# Patient Record
Sex: Male | Born: 1985 | Race: Black or African American | Hispanic: No | Marital: Single | Smoking: Current every day smoker
Health system: Southern US, Community
[De-identification: ages and names within clinical notes are randomized; demographics above are authoritative.]

---

## 2006-04-07 ENCOUNTER — Emergency Department (HOSPITAL_COMMUNITY): Admission: EM | Admit: 2006-04-07 | Discharge: 2006-04-07 | Payer: Self-pay | Admitting: Emergency Medicine

## 2006-08-28 ENCOUNTER — Emergency Department (HOSPITAL_COMMUNITY): Admission: EM | Admit: 2006-08-28 | Discharge: 2006-08-28 | Payer: Self-pay | Admitting: *Deleted

## 2007-10-16 ENCOUNTER — Emergency Department (HOSPITAL_COMMUNITY): Admission: EM | Admit: 2007-10-16 | Discharge: 2007-10-17 | Payer: Self-pay | Admitting: Emergency Medicine

## 2008-02-02 ENCOUNTER — Emergency Department (HOSPITAL_COMMUNITY): Admission: EM | Admit: 2008-02-02 | Discharge: 2008-02-02 | Payer: Self-pay | Admitting: Emergency Medicine

## 2010-10-22 ENCOUNTER — Emergency Department (HOSPITAL_BASED_OUTPATIENT_CLINIC_OR_DEPARTMENT_OTHER)
Admission: EM | Admit: 2010-10-22 | Discharge: 2010-10-22 | Disposition: A | Discharge status: Home / Self Care | Payer: Self-pay | Attending: Emergency Medicine | Admitting: Emergency Medicine

## 2010-10-22 ENCOUNTER — Encounter: Payer: Self-pay | Admitting: *Deleted

## 2010-10-22 DIAGNOSIS — T300 Burn of unspecified body region, unspecified degree: Secondary | ICD-10-CM

## 2010-10-22 DIAGNOSIS — T23119A Burn of first degree of unspecified thumb (nail), initial encounter: Secondary | ICD-10-CM | POA: Insufficient documentation

## 2010-10-22 DIAGNOSIS — X12XXXA Contact with other hot fluids, initial encounter: Secondary | ICD-10-CM | POA: Insufficient documentation

## 2010-10-22 MED ORDER — HYDROCODONE-ACETAMINOPHEN 5-325 MG PO TABS: 1.0000 | ORAL_TABLET | ORAL | Status: AC | PRN

## 2010-10-22 MED ORDER — HYDROCODONE-ACETAMINOPHEN 5-325 MG PO TABS
1.0000 | ORAL_TABLET | Freq: Once | ORAL | Status: AC
  Administered 2010-10-22: 1 via ORAL
  Filled 2010-10-22: qty 1

## 2010-10-22 MED ORDER — IBUPROFEN 800 MG PO TABS: 800.0000 mg | ORAL_TABLET | Freq: Three times a day (TID) | ORAL | Status: AC

## 2010-10-22 NOTE — ED Provider Notes (Signed)
History     CSN: [id number] Arrival date & time: 10/22/2010  8:58 PM  Chief Complaint  Patient presents with  . Hand Burn   HPI Pt was putting a piece of chicken in frying pan and the grease splashed onto right hand.  Covered hand w/ butter to ease pain but it is still severe.  No associated symptoms and otherwise feeling well.   History reviewed. No pertinent past medical history.  History reviewed. No pertinent past surgical history.  No family history on file.  History  Substance Use Topics  . Smoking status: Current Everyday Smoker -- 0.5 packs/day  . Smokeless tobacco: Not on file  . Alcohol Use: Yes     several times a week      Review of Systems  All other systems reviewed and are negative.    Physical Exam  BP 133/84  Pulse 65  Temp(Src) 98 F (36.7 C) (Oral)  Resp 20  SpO2 100%  Physical Exam  Nursing note and vitals reviewed. Constitutional: He is oriented to person, place, and time. He appears well-developed and well-nourished. No distress.  HENT:  Head: Normocephalic and atraumatic.  Eyes:       Normal appearance  Neck: Normal range of motion.  Neurological: He is alert and oriented to person, place, and time.  Skin: Skin is warm and dry. No rash noted.       Right thenar eminence and palmar surface of thumb erythematous and ttp.  No blisters.  Passive ROM of thumb painful.  Distal sensation intact.   Psychiatric: He has a normal mood and affect. His behavior is normal.    ED Course  Procedures  MDM Pt presents w/ 1st deg burn of right thumb.  Pain has improved in ED w/ ice.  Nursing staff wrapped in moist gauze.  Pt discharged home w/ pain medication and recommendations for conservative therapy.  Signs of secondary infection discussed.       Otilio Miu, PA 10/22/10 2216  Otilio Miu, PA 10/22/10 2217
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
## 2010-10-25 NOTE — ED Provider Notes (Signed)
History/physical exam/procedure(s) were performed by non-physician practitioner and as supervising physician I was immediately available for consultation/collaboration. I have reviewed all notes and am in agreement with care and plan.   Hilario Quarry, MD 10/25/10 [phone]

## 2010-12-09 LAB — ETHANOL: Alcohol, Ethyl (B): 59 — ABNORMAL HIGH

## 2010-12-24 LAB — CBC
HCT: 40.4
MCV: 74 — ABNORMAL LOW
Platelets: 241
RBC: 5.46
RDW: 13
WBC: 6.9

## 2010-12-24 LAB — URINALYSIS, ROUTINE W REFLEX MICROSCOPIC
Ketones, ur: 15 — AB
Urobilinogen, UA: 1
pH: 6

## 2010-12-24 LAB — COMPREHENSIVE METABOLIC PANEL
ALT: 10
AST: 18
Albumin: 3.9
BUN: 10
Calcium: 8.8
Creatinine, Ser: 1.01
GFR calc non Af Amer: >60
Total Protein: 6.1

## 2010-12-24 LAB — URINE MICROSCOPIC-ADD ON

## 2010-12-24 LAB — DIFFERENTIAL: Monocytes Absolute: 0.5

## 2011-02-23 ENCOUNTER — Emergency Department (HOSPITAL_BASED_OUTPATIENT_CLINIC_OR_DEPARTMENT_OTHER)
Admission: EM | Admit: 2011-02-23 | Discharge: 2011-02-24 | Disposition: A | Discharge status: Home / Self Care | Payer: Self-pay | Attending: Emergency Medicine | Admitting: Emergency Medicine

## 2011-02-23 ENCOUNTER — Emergency Department (INDEPENDENT_AMBULATORY_CARE_PROVIDER_SITE_OTHER): Payer: Self-pay

## 2011-02-23 ENCOUNTER — Encounter (HOSPITAL_BASED_OUTPATIENT_CLINIC_OR_DEPARTMENT_OTHER): Payer: Self-pay | Admitting: *Deleted

## 2011-02-23 DIAGNOSIS — S022XXB Fracture of nasal bones, initial encounter for open fracture: Secondary | ICD-10-CM | POA: Insufficient documentation

## 2011-02-23 DIAGNOSIS — W19XXXA Unspecified fall, initial encounter: Secondary | ICD-10-CM

## 2011-02-23 DIAGNOSIS — S0120XA Unspecified open wound of nose, initial encounter: Secondary | ICD-10-CM | POA: Insufficient documentation

## 2011-02-23 DIAGNOSIS — M25579 Pain in unspecified ankle and joints of unspecified foot: Secondary | ICD-10-CM | POA: Insufficient documentation

## 2011-02-23 DIAGNOSIS — S82891A Other fracture of right lower leg, initial encounter for closed fracture: Secondary | ICD-10-CM

## 2011-02-23 MED ORDER — TETANUS-DIPHTH-ACELL PERTUSSIS 5-2.5-18.5 LF-MCG/0.5 IM SUSP
0.5000 mL | Freq: Once | INTRAMUSCULAR | Status: AC
  Administered 2011-02-24: 0.5 mL via INTRAMUSCULAR
  Filled 2011-02-23: qty 0.5

## 2011-02-23 MED ORDER — LIDOCAINE HCL (PF) 1 % IJ SOLN
5.0000 mL | Freq: Once | INTRAMUSCULAR | Status: AC
  Administered 2011-02-24: 5 mL
  Filled 2011-02-23: qty 5

## 2011-02-23 NOTE — ED Notes (Signed)
Pt has laceration to left cheek. Pt has abrasion to left posterior shoulder. No active bleeding.
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
## 2011-02-24 DIAGNOSIS — W19XXXA Unspecified fall, initial encounter: Secondary | ICD-10-CM

## 2011-02-24 DIAGNOSIS — J3489 Other specified disorders of nose and nasal sinuses: Secondary | ICD-10-CM

## 2011-02-24 DIAGNOSIS — S022XXA Fracture of nasal bones, initial encounter for closed fracture: Secondary | ICD-10-CM

## 2011-02-24 MED ORDER — HYDROCODONE-ACETAMINOPHEN 5-500 MG PO TABS: 1.0000 | ORAL_TABLET | Freq: Four times a day (QID) | ORAL | Status: AC | PRN

## 2011-02-24 MED ORDER — CEPHALEXIN 500 MG PO CAPS: 500.0000 mg | ORAL_CAPSULE | Freq: Four times a day (QID) | ORAL | Status: AC

## 2011-02-24 MED ORDER — CEPHALEXIN 250 MG PO CAPS
500.0000 mg | ORAL_CAPSULE | Freq: Once | ORAL | Status: AC
  Administered 2011-02-24: 500 mg via ORAL
  Filled 2011-02-24: qty 2

## 2011-02-24 MED ORDER — HYDROCODONE-ACETAMINOPHEN 5-325 MG PO TABS
2.0000 | ORAL_TABLET | Freq: Once | ORAL | Status: AC
  Administered 2011-02-24: 2 via ORAL
  Filled 2011-02-24: qty 2

## 2013-10-28 ENCOUNTER — Emergency Department (HOSPITAL_COMMUNITY)
Admission: EM | Admit: 2013-10-28 | Discharge: 2013-10-28 | Disposition: A | Discharge status: Home / Self Care | Payer: Self-pay | Attending: Emergency Medicine | Admitting: Emergency Medicine

## 2013-10-28 ENCOUNTER — Encounter (HOSPITAL_COMMUNITY): Payer: Self-pay | Admitting: Emergency Medicine

## 2013-10-28 DIAGNOSIS — K089 Disorder of teeth and supporting structures, unspecified: Secondary | ICD-10-CM | POA: Insufficient documentation

## 2013-10-28 DIAGNOSIS — F17200 Nicotine dependence, unspecified, uncomplicated: Secondary | ICD-10-CM | POA: Insufficient documentation

## 2013-10-28 DIAGNOSIS — K0889 Other specified disorders of teeth and supporting structures: Secondary | ICD-10-CM

## 2013-10-28 MED ORDER — PENICILLIN V POTASSIUM 500 MG PO TABS: 500.0000 mg | ORAL_TABLET | Freq: Four times a day (QID) | ORAL | Status: DC

## 2013-10-28 MED ORDER — HYDROCODONE-ACETAMINOPHEN 5-325 MG PO TABS
2.0000 | ORAL_TABLET | Freq: Once | ORAL | Status: AC
  Administered 2013-10-28: 2 via ORAL
  Filled 2013-10-28: qty 2

## 2013-10-28 MED ORDER — HYDROCODONE-ACETAMINOPHEN 5-325 MG PO TABS: 2.0000 | ORAL_TABLET | ORAL | Status: DC | PRN

## 2013-10-28 MED ORDER — PENICILLIN V POTASSIUM 500 MG PO TABS
500.0000 mg | ORAL_TABLET | Freq: Once | ORAL | Status: AC
  Administered 2013-10-28: 500 mg via ORAL
  Filled 2013-10-28: qty 1

## 2013-10-28 NOTE — ED Provider Notes (Signed)
CSN: [id number]     Arrival date & time 10/28/13  1947 History  This chart was scribed for non-physician practitioner working with Lee HornJohn M Bednar, MD by Lee Fleming, ED Scribe. This patient was seen in room WTR6/WTR6 and the patient's care was started at 8:42 PM.   Chief Complaint  Patient presents with  . Dental Pain   The history is provided by the patient. No language interpreter was used.   HPI Comments: Lee Fleming is a 28 y.o. male who presents to the Emergency Department complaining of constant left upper dental pain onset three or four days ago. He reports worsening last night and left facial swelling upon wakening this morning. Patient reports constant dental pain throughout the day while at work, prompting him to visit the ED tonight. Patient reports treatment with ibuprofen without relief. Patient denies abscess, infection, or chipped tooth.    History reviewed. No pertinent past medical history. History reviewed. No pertinent past surgical history. No family history on file. History  Substance Use Topics  . Smoking status: Current Every Day Smoker -- 0.50 packs/day  . Smokeless tobacco: Not on file  . Alcohol Use: Yes     Comment: several times a week    Review of Systems  HENT: Positive for dental problem.       Allergies  Review of patient's allergies indicates no known allergies.  Home Medications   Prior to Admission medications   Not on File   Triage Vitals: BP 118/68  Pulse 63  Temp(Src) 98.9 F (37.2 C) (Oral)  Resp 17  SpO2 100%  Physical Exam  Nursing note and vitals reviewed. Constitutional: He is oriented to person, place, and time. He appears well-developed and well-nourished. No distress.  HENT:  Head: Normocephalic and atraumatic.  No trismus. Left upper posterior molar tender to percussion. No drainable abscess noted.   Eyes: Conjunctivae and EOM are normal.  Neck: Normal range of motion. Neck supple.  Cardiovascular: Normal rate and  regular rhythm.  Exam reveals no gallop and no friction rub.   No murmur heard. Pulmonary/Chest: Effort normal and breath sounds normal. He has no wheezes. He has no rales. He exhibits no tenderness.  Abdominal: Soft. There is no tenderness.  Musculoskeletal: Normal range of motion.  Lymphadenopathy:    He has no cervical adenopathy.  Neurological: He is alert and oriented to person, place, and time.  Speech is goal-oriented. Moves limbs without ataxia.   Skin: Skin is warm and dry.  Psychiatric: He has a normal mood and affect. His behavior is normal.    ED Course  Procedures (including critical care time)  COORDINATION OF CARE: 8:44 PM- Will prescribe antibiotic. Patient advised to return to ED if his facial swelling significantly worsens. Discussed treatment plan with patient at bedside and patient agreed to plan.   Labs Review Labs Reviewed - No data to display  Imaging Review No results found.   EKG Interpretation None      MDM   Final diagnoses:  Pain, dental    .8:53 PM Patient will have Veetid and Vicodin here for symptoms. Patient has no signs of abscess or Ludwigs angina at this time. Patient given dental resources for follow up. Patient instructed to return with worsening or concerning symptoms.   I personally performed the services described in this documentation, which was scribed in my presence. The recorded information has been reviewed and is accurate.    Lee Fleming, [place]PA-C 10/28/13 2053

## 2013-10-28 NOTE — ED Notes (Signed)
Pt has a ride home.  

## 2013-10-28 NOTE — ED Notes (Signed)
Pt c/o pain to upper molar area to L side of mouth. Pt states pain started about 3 days ago. Reports now he has some swelling to L side of face. Pt has no acute distress.

## 2013-10-28 NOTE — Discharge Instructions (Signed)
Take Veetid as directed until gone. Take Vicodin as needed for pain. Follow up with Dr. Russella Dar or go to the free dental clinic next weekend. Refer to attached documents for more information.

## 2013-11-07 NOTE — ED Provider Notes (Signed)
Medical screening examination/treatment/procedure(s) were performed by non-physician practitioner and as supervising physician I was immediately available for consultation/collaboration.   EKG Interpretation None       Hurman Horn, MD 11/07/13 2005

## 2015-12-22 ENCOUNTER — Ambulatory Visit (HOSPITAL_COMMUNITY)
Admission: EM | Admit: 2015-12-22 | Discharge: 2015-12-22 | Disposition: A | Discharge status: Home / Self Care | Payer: Self-pay | Attending: Internal Medicine | Admitting: Internal Medicine

## 2015-12-22 ENCOUNTER — Encounter (HOSPITAL_COMMUNITY): Payer: Self-pay | Admitting: *Deleted

## 2015-12-22 DIAGNOSIS — K047 Periapical abscess without sinus: Secondary | ICD-10-CM

## 2015-12-22 DIAGNOSIS — R220 Localized swelling, mass and lump, head: Secondary | ICD-10-CM

## 2015-12-22 MED ORDER — PENICILLIN V POTASSIUM 500 MG PO TABS: 500.0000 mg | ORAL_TABLET | Freq: Four times a day (QID) | ORAL | 0 refills | Status: DC

## 2015-12-22 MED ORDER — TRAMADOL HCL 50 MG PO TABS: 50.0000 mg | ORAL_TABLET | Freq: Four times a day (QID) | ORAL | 0 refills | Status: DC | PRN

## 2015-12-22 MED ORDER — PENICILLIN V POTASSIUM 500 MG PO TABS: 500.0000 mg | ORAL_TABLET | Freq: Four times a day (QID) | ORAL | 0 refills | Status: AC

## 2015-12-22 NOTE — ED Triage Notes (Signed)
Pt  Has  Pain  And  Swelling  To r  Side  Of  Face    With toothache      With  Symptoms  X  2  Days    Pain is in  The  Upper  Jaw  Area        Pt   Sitting    Upright  On the    Exam   Table      In no  Distress  Other than the  Toothache

## 2015-12-22 NOTE — ED Provider Notes (Signed)
CSN: [id number]     Arrival date & time 12/22/15  1258 History   First MD Initiated Contact with Patient 12/22/15 1352     Chief Complaint  Patient presents with  . Facial Swelling   (Consider location/radiation/quality/duration/timing/severity/associated sxs/prior Treatment) Pt here for tooth pain and facial swelling for 2 days now. States that he has a hx of tooth caries. Has not seen a dentist due to financial issues. Has taken Excedrin for pain with minimal relief. Was seen 2 months ago for the same thing. Offered to give a dental block to help with the pain and pt refused states that he rather use the pain medication. Has minimal swelling to upper lip. Denies any sob, no chest pain, no resp distress.       History reviewed. No pertinent past medical history. History reviewed. No pertinent surgical history. History reviewed. No pertinent family history. Social History  Substance Use Topics  . Smoking status: Current Every Day Smoker    Packs/day: 0.50  . Smokeless tobacco: Not on file  . Alcohol use Yes     Comment: several times a week    Review of Systems  Constitutional: Negative.   HENT: Positive for dental problem and facial swelling.   Eyes: Negative.   Respiratory: Negative.   Cardiovascular: Negative.     Allergies  Review of patient's allergies indicates no known allergies.  Home Medications   Prior to Admission medications   Medication Sig Start Date End Date Taking? Authorizing Provider  penicillin v potassium (VEETID) 500 MG tablet Take 1 tablet (500 mg total) by mouth 4 (four) times daily. 12/22/15 12/29/15  Tobi BastosMelanie A Deandra Goering, NP  traMADol (ULTRAM) 50 MG tablet Take 1 tablet (50 mg total) by mouth every 6 (six) hours as needed. 12/22/15   Tobi BastosMelanie A Jarmarcus Wambold, NP   Meds Ordered and Administered this Visit  Medications - No data to display  BP 120/70 (BP Location: Right Arm)   Pulse 68   Temp 98.6 F (37 C) (Oral)   Resp 18   SpO2 100%  No data  found.   Physical Exam  Constitutional: He appears well-developed.  HENT:  multiple dental caries to front 1st pre molar and 2nd molar.   Eyes: Pupils are equal, round, and reactive to light.  Neck: Normal range of motion.  Cardiovascular: Normal rate and regular rhythm.   Pulmonary/Chest: Effort normal and breath sounds normal.    Urgent Care Course   Clinical Course    Procedures (including critical care time)  Labs Review Labs Reviewed - No data to display  Imaging Review No results found.               MDM   1. Dental abscess   2. Facial swelling   3. Dental infection    You will need to see a dentist as soon as possible. Multiple dental caries noted. Take full dose of medications even if you feel better. Take Motrin also to help with pain 800mg  every 6 hours. Educated on the risk of non treatment of caries.     Tobi BastosMelanie A Aila Terra, NP 12/22/15 1414

## 2015-12-22 NOTE — Discharge Instructions (Signed)
You will need to see a dentist as soon as possible. Multiple dental caries noted. Take full dose of medications even if you feel better. Take Motrin also to help with pain 800mg  every 6 hours. Educated on the risk of non treatment of caries.

## 2017-10-10 ENCOUNTER — Emergency Department (HOSPITAL_COMMUNITY): Payer: Self-pay

## 2017-10-10 ENCOUNTER — Other Ambulatory Visit: Payer: Self-pay

## 2017-10-10 ENCOUNTER — Emergency Department (HOSPITAL_COMMUNITY)
Admission: EM | Admit: 2017-10-10 | Discharge: 2017-10-10 | Disposition: A | Discharge status: Home / Self Care | Payer: Self-pay | Attending: Emergency Medicine | Admitting: Emergency Medicine

## 2017-10-10 ENCOUNTER — Encounter (HOSPITAL_COMMUNITY): Payer: Self-pay | Admitting: Emergency Medicine

## 2017-10-10 DIAGNOSIS — M79644 Pain in right finger(s): Secondary | ICD-10-CM | POA: Insufficient documentation

## 2017-10-10 DIAGNOSIS — F17200 Nicotine dependence, unspecified, uncomplicated: Secondary | ICD-10-CM | POA: Insufficient documentation

## 2017-10-10 MED ORDER — KETOROLAC TROMETHAMINE 30 MG/ML IJ SOLN
30.0000 mg | Freq: Once | INTRAMUSCULAR | Status: AC
  Administered 2017-10-10: 30 mg via INTRAMUSCULAR
  Filled 2017-10-10: qty 1

## 2017-10-10 MED ORDER — OXYCODONE-ACETAMINOPHEN 5-325 MG PO TABS
1.0000 | ORAL_TABLET | Freq: Once | ORAL | Status: AC
  Administered 2017-10-10: 1 via ORAL
  Filled 2017-10-10: qty 1

## 2017-10-10 NOTE — ED Provider Notes (Signed)
[facility]-EMERGENCY DEPT Provider Note   CSN: [id number] Arrival date & time: 10/10/17  1425     History   Chief Complaint Chief Complaint  Patient presents with  . Hand Injury    HPI Lee Fleming is a 32 y.o. male who presents today for evaluation of right thumb pain.  He reports that his pain started on Friday while he was at work.  He works as a Financial risk analyst.  He denies any specific trauma, reports that his pain is gradually worsened since then.  He denies any fevers, chills, or systemic symptoms.  He denies any penetrating wounds to the thumb recently.  No personal history of gout.   He has never had any problems like this in the past.  HPI  History reviewed. No pertinent past medical history.  There are no active problems to display for this patient.   History reviewed. No pertinent surgical history.      Home Medications    Prior to Admission medications   Medication Sig Start Date End Date Taking? Authorizing Provider  traMADol (ULTRAM) 50 MG tablet Take 1 tablet (50 mg total) by mouth every 6 (six) hours as needed. 12/22/15   Coralyn Mark, NP    Family History No family history on file.  Social History Social History   Tobacco Use  . Smoking status: Current Every Day Smoker    Packs/day: 0.50  Substance Use Topics  . Alcohol use: Yes    Comment: several times a week  . Drug use: No     Allergies   Patient has no known allergies.   Review of Systems Review of Systems  Constitutional: Negative for chills, diaphoresis, fatigue, fever and unexpected weight change.  Respiratory: Negative for shortness of breath.   Cardiovascular: Negative for chest pain.  Gastrointestinal: Negative for abdominal pain, diarrhea, nausea and vomiting.  Musculoskeletal: Positive for arthralgias and joint swelling. Negative for back pain and neck pain.  Neurological: Negative for weakness and headaches.  Psychiatric/Behavioral: Negative for  confusion.  All other systems reviewed and are negative.    Physical Exam Updated Vital Signs BP 133/84 (BP Location: Left Arm)   Pulse 78   Temp 98.4 F (36.9 C) (Oral)   Resp 18   Ht 5\' 6"  (1.676 m)   Wt 65.8 kg (145 lb)   SpO2 100%   BMI 23.40 kg/m   Physical Exam  Constitutional: He is oriented to person, place, and time. He appears well-developed. No distress.  HENT:  Head: Normocephalic and atraumatic.  Cardiovascular: Intact distal pulses.  Brisk capillary refill of distal right thumb.  Musculoskeletal:  Right thumb is swollen primarily over the thenar eminence.  Area is not abnormally warm.  He has pain with active or passive range of motion of the entire thumb, primarily worse with flexion and opposition.  Neurological: He is alert and oriented to person, place, and time.  Sensation intact to right distal thumb.  Skin: Skin is warm and dry. He is not diaphoretic.  No wounds on right hand, right thumb.  There is no abnormal erythema to the right thumb.  Nursing note and vitals reviewed.    ED Treatments / Results  Labs (all labs ordered are listed, but only abnormal results are displayed) Labs Reviewed - No data to display  EKG None  Radiology Dg Hand Complete Right  Result Date: 10/10/2017 CLINICAL DATA:  Jammed thumb at work 2 days ago.  Pain and swelling. EXAM: RIGHT HAND -  COMPLETE 3+ VIEW COMPARISON:  None. FINDINGS: The mineralization and alignment are normal. There is no evidence of acute fracture or dislocation. The joint spaces are maintained. No focal soft tissue swelling identified. IMPRESSION: No acute osseous findings. Electronically Signed   By: Carey BullocksWilliam  Veazey M.D.   On: 10/10/2017 15:44    Procedures Procedures (including critical care time)  Medications Ordered in ED Medications  ketorolac (TORADOL) 30 MG/ML injection 30 mg (has no administration in time range)  oxyCODONE-acetaminophen (PERCOCET/ROXICET) 5-325 MG per tablet 1 tablet (has  no administration in time range)     Initial Impression / Assessment and Plan / ED Course  I have reviewed the triage vital signs and the nursing notes.  Pertinent labs & imaging results that were available during my care of the patient were reviewed by me and considered in my medical decision making (see chart for details).    Patient presents today for evaluation of right thumb pain.  No injury.  X-rays were obtained without fracture or other acute abnormality.  He does not have systemic symptoms, is afebrile, and the joint is not hot or red, not consistent with septic arthritis.  Lower suspicion for gout given patient's age, and that the joint is not hot or red.  Suspect a tendinitis, probably from repetitive motions at work.  Plan for removable thumb spica Velcro splint, NSAIDs, and hand follow-up.   Return precautions were discussed with patient who states their understanding.  At the time of discharge patient denied any unaddressed complaints or concerns.  Patient is agreeable for discharge home.    Final Clinical Impressions(s) / ED Diagnoses   Final diagnoses:  Pain of right thumb    ED Discharge Orders    None       Norman ClayHammond, Salvator Seppala W, PA-C 10/10/17 Tonna [id number]    Isaacs, Cameron, MD 10/12/17 [phone]
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
## 2017-10-10 NOTE — ED Notes (Signed)
PT DISCHARGED. INSTRUCTIONS AND PRESCRIPTION GIVEN. AAOX4. PT IN NO APPARENT DISTRESS WITH SEVERE PAIN. THE OPPORTUNITY TO ASK QUESTIONS WAS PROVIDED. 

## 2017-10-10 NOTE — Discharge Instructions (Addendum)
Please take Ibuprofen (Advil, motrin) and Tylenol (acetaminophen) to relieve your pain.  You may take up to 600 MG (3 pills) of normal strength ibuprofen every 8 hours as needed.  In between doses of ibuprofen you make take tylenol, up to 1,000 mg (two extra strength pills).  Do not take more than 3,000 mg tylenol in a 24 hour period.  Please check all medication labels as many medications such as pain and cold medications may contain tylenol.  Do not drink alcohol while taking these medications.  Do not take other NSAID'S while taking ibuprofen (such as aleve or naproxen).  Please take ibuprofen with food to decrease stomach upset.  Today in the emergency room you are given a shot of Toradol.  Please do not take ibuprofen for 8 hours after this.  You may remove your splint to wash your hands and shower.  You may try ice 20 minutes on 20 minutes off making sure that you protect your skin with a sheet or towel, or heat whichever makes it feel better.

## 2018-03-10 ENCOUNTER — Emergency Department (HOSPITAL_COMMUNITY)
Admission: EM | Admit: 2018-03-10 | Discharge: 2018-03-10 | Disposition: A | Discharge status: Home / Self Care | Payer: Self-pay | Attending: Emergency Medicine | Admitting: Emergency Medicine

## 2018-03-10 ENCOUNTER — Encounter (HOSPITAL_COMMUNITY): Payer: Self-pay | Admitting: Emergency Medicine

## 2018-03-10 DIAGNOSIS — F17210 Nicotine dependence, cigarettes, uncomplicated: Secondary | ICD-10-CM | POA: Insufficient documentation

## 2018-03-10 DIAGNOSIS — S01511A Laceration without foreign body of lip, initial encounter: Secondary | ICD-10-CM | POA: Insufficient documentation

## 2018-03-10 DIAGNOSIS — Y929 Unspecified place or not applicable: Secondary | ICD-10-CM | POA: Insufficient documentation

## 2018-03-10 DIAGNOSIS — Y990 Civilian activity done for income or pay: Secondary | ICD-10-CM | POA: Insufficient documentation

## 2018-03-10 DIAGNOSIS — W208XXA Other cause of strike by thrown, projected or falling object, initial encounter: Secondary | ICD-10-CM | POA: Insufficient documentation

## 2018-03-10 DIAGNOSIS — Y939 Activity, unspecified: Secondary | ICD-10-CM | POA: Insufficient documentation

## 2018-03-10 MED ORDER — LIDOCAINE-EPINEPHRINE (PF) 2 %-1:200000 IJ SOLN
10.0000 mL | Freq: Once | INTRAMUSCULAR | Status: AC
  Administered 2018-03-10: 10 mL via INTRADERMAL
  Filled 2018-03-10: qty 20

## 2018-03-10 NOTE — ED Notes (Signed)
Pt stable and ambulatory for discharge, states understanding follow up.  

## 2018-03-10 NOTE — ED Provider Notes (Signed)
[facility]   CSN: [id number] Arrival date & time: 03/10/18  1818     History   Chief Complaint Chief Complaint  Patient presents with  . Lip Laceration    HPI Lee Fleming is a 33 y.o. male presenting for evaluation of acute onset, persistent wound to the philthrum.  He reports approximately 6 hours ago while at work a pole landed on him sustaining a wound superior to the upper lip.  Denies loss of consciousness.  Denies significant headaches, vision changes, nausea, vomiting, or weakness reports that he did wash the wound out and took a shower and afterwards felt as though he might need stitches so he presented to the ED for further evaluation.  Denies dental pain.  States his tetanus is up-to-date  The history is provided by the patient.    History reviewed. No pertinent past medical history.  There are no active problems to display for this patient.   History reviewed. No pertinent surgical history.      Home Medications    Prior to Admission medications   Medication Sig Start Date End Date Taking? Authorizing Provider  traMADol (ULTRAM) 50 MG tablet Take 1 tablet (50 mg total) by mouth every 6 (six) hours as needed. 12/22/15   Coralyn MarkMitchell, Melanie L, NP    Family History No family history on file.  Social History Social History   Tobacco Use  . Smoking status: Current Every Day Smoker    Packs/day: 0.50  . Smokeless tobacco: Never Used  Substance Use Topics  . Alcohol use: Yes    Comment: several times a week  . Drug use: No     Allergies   Patient has no known allergies.   Review of Systems Review of Systems  HENT: Negative for dental problem.   Eyes: Negative for visual disturbance.  Gastrointestinal: Negative for nausea and vomiting.  Skin: Positive for wound.  Neurological: Negative for syncope and headaches.     Physical Exam Updated Vital Signs BP 100/66   Pulse 76   Temp 98.9 F  (37.2 C) (Oral)   Resp 16   Ht 5\' 9"  (1.753 m)   Wt 72.6 kg   SpO2 100%   BMI 23.63 kg/m   Physical Exam Vitals signs and nursing note reviewed.  Constitutional:      General: He is not in acute distress.    Appearance: He is well-developed.  HENT:     Head: Normocephalic.     Comments: See below image.  1.5 cm semilunar laceration superior to the upper lip along the philthrum.  Is not a full-thickness wound.  He does have superficial abrasion to the vestibular mucosa of the upper lip.  No active bleeding.  Dentition appears stable.  No battle signs, raccoon eyes, or rhinorrhea.  No tenderness to palpation, deformity, or crepitus noted on palpation of the face or skull. Eyes:     General:        Right eye: No discharge.        Left eye: No discharge.     Conjunctiva/sclera: Conjunctivae normal.  Neck:     Vascular: No JVD.     Trachea: No tracheal deviation.  Cardiovascular:     Rate and Rhythm: Normal rate.  Pulmonary:     Effort: Pulmonary effort is normal.  Abdominal:     General: There is no distension.  Skin:    General: Skin is warm and dry.  Findings: No erythema.  Neurological:     General: No focal deficit present.     Mental Status: He is alert.  Psychiatric:        Behavior: Behavior normal.        ED Treatments / Results  Labs (all labs ordered are listed, but only abnormal results are displayed) Labs Reviewed - No data to display  EKG None  Radiology No results found.  Procedures .Marland KitchenLaceration Repair Date/Time: 03/10/2018 9:30 PM Performed by: Jeanie Sewer, PA-C Authorized by: Jeanie Sewer, PA-C   Consent:    Consent obtained:  Verbal   Consent given by:  Patient   Risks discussed:  Infection, pain, poor cosmetic result, poor wound healing and need for additional repair   Alternatives discussed:  No treatment and delayed treatment Anesthesia (see MAR for exact dosages):    Anesthesia method:  Local infiltration   Local anesthetic:   Lidocaine 2% WITH epi Laceration details:    Location:  Lip   Lip location:  Upper exterior lip   Length (cm):  1.5   Depth (mm):  2 Repair type:    Repair type:  Simple Pre-procedure details:    Preparation:  Patient was prepped and draped in usual sterile fashion Exploration:    Hemostasis achieved with:  Direct pressure   Wound exploration: wound explored through full range of motion and entire depth of wound probed and visualized     Wound extent: areolar tissue violated     Contaminated: yes   Treatment:    Area cleansed with:  Betadine and saline   Amount of cleaning:  Extensive   Irrigation solution:  Sterile saline   Irrigation method:  Pressure wash   Visualized foreign bodies/material removed: no   Skin repair:    Repair method:  Sutures   Suture size:  6-0   Suture material:  Prolene   Suture technique:  Simple interrupted   Number of sutures:  3 Approximation:    Approximation:  Close   Vermilion border: well-aligned   Post-procedure details:    Dressing:  Open (no dressing)   Patient tolerance of procedure:  Tolerated well, no immediate complications   (including critical care time)  Medications Ordered in ED Medications  lidocaine-EPINEPHrine (XYLOCAINE W/EPI) 2 %-1:200000 (PF) injection 10 mL (10 mLs Intradermal Given 03/10/18 2100)     Initial Impression / Assessment and Plan / ED Course  I have reviewed the triage vital signs and the nursing notes.  Pertinent labs & imaging results that were available during my care of the patient were reviewed by me and considered in my medical decision making (see chart for details).     Patient with superficial upper lip laceration (superior to the upper lip, does not involve the vermilion border) after head injury earlier today.  No loss of consciousness.  No signs of serious head injury.   Normal neuro exam.   Do not see a need for emergent head imaging. He is afebrile, vital signs are stable. Pressure irrigation  performed. Wound explored and base of wound visualized in a bloodless field without evidence of foreign body.  Laceration occurred < 8 hours prior to repair which was well tolerated.  Reports Tdap is up-to-date.  Pt has  no comorbidities to effect normal wound healing. Pt discharged  without antibiotics.  Discussed suture home care with patient and answered questions. Pt to follow-up for wound check and suture removal in 5 days; they are to return to the  ED sooner for signs of infection or serious head injury. Pt verbalized understanding of and agreement with plan and is safe for discharge home at this time.  Final Clinical Impressions(s) / ED Diagnoses   Final diagnoses:  Lip laceration, initial encounter    ED Discharge Orders    None       Jeanie SewerFawze, Deondrea Markos A, PA-C 03/10/18 2248    Rolan BuccoBelfi, Melanie, MD 03/10/18 2322

## 2018-03-10 NOTE — Discharge Instructions (Signed)
1. Medications: Alternate 600 mg of ibuprofen and [phone] mg of Tylenol every 3 hours as needed for pain. Do not exceed 4000 mg of Tylenol daily.  Take ibuprofen with food to avoid upset stomach issues. 2. Treatment: ice for swelling, keep wound clean with warm soap and water and keep bandage dry, do not submerge in water for 24 hours 3. Follow Up: Please return in 5 days to have your stitches/staples removed or sooner if you have concerns.  You may also go to urgent care or your primary care physician.  Return to the emergency department sooner if any concerning signs or symptoms develop such as high fevers, redness, drainage of pus from the wound, or swelling.   WOUND CARE  Keep area clean and dry for 24 hours. Do not remove bandage, if applied.  After 24 hours, remove bandage and wash wound gently with mild soap and warm water. Reapply a new bandage after cleaning wound, if directed.   Continue daily cleansing with soap and water until stitches/staples are removed.  Do not apply any ointments or creams to the wound while stitches/staples are in place, as this may cause delayed healing. Return if you experience any of the following signs of infection: Swelling, redness, pus drainage, streaking, fever >101.0 F  Return if you experience excessive bleeding that does not stop after 15-20 minutes of constant, firm pressure.

## 2018-03-10 NOTE — ED Triage Notes (Signed)
Pt states pole fell on lip, bleeding controlled. Small laceration on upper lip

## 2018-03-16 ENCOUNTER — Emergency Department (HOSPITAL_COMMUNITY): Payer: Self-pay

## 2018-03-16 ENCOUNTER — Encounter (HOSPITAL_COMMUNITY): Payer: Self-pay

## 2018-03-16 ENCOUNTER — Emergency Department (HOSPITAL_COMMUNITY)
Admission: EM | Admit: 2018-03-16 | Discharge: 2018-03-16 | Disposition: A | Discharge status: Home / Self Care | Payer: Self-pay | Attending: Emergency Medicine | Admitting: Emergency Medicine

## 2018-03-16 ENCOUNTER — Other Ambulatory Visit: Payer: Self-pay

## 2018-03-16 DIAGNOSIS — R079 Chest pain, unspecified: Secondary | ICD-10-CM | POA: Insufficient documentation

## 2018-03-16 DIAGNOSIS — Z4802 Encounter for removal of sutures: Secondary | ICD-10-CM

## 2018-03-16 DIAGNOSIS — Z4800 Encounter for change or removal of nonsurgical wound dressing: Secondary | ICD-10-CM | POA: Insufficient documentation

## 2018-03-16 DIAGNOSIS — F17210 Nicotine dependence, cigarettes, uncomplicated: Secondary | ICD-10-CM | POA: Insufficient documentation

## 2018-03-16 DIAGNOSIS — R0789 Other chest pain: Secondary | ICD-10-CM

## 2018-03-16 LAB — BASIC METABOLIC PANEL
ANION GAP: 10 (ref 5–15)
BUN: 8 mg/dL (ref 6–20)
CO2: 23 mmol/L (ref 22–32)
Calcium: 9.1 mg/dL (ref 8.9–10.3)
Chloride: 106 mmol/L (ref 98–111)
Creatinine, Ser: 1.06 mg/dL (ref 0.61–1.24)
GFR calc non Af Amer: >60 mL/min (ref >60)
GLUCOSE: 85 mg/dL (ref 70–99)
POTASSIUM: 4.1 mmol/L (ref 3.5–5.1)
Sodium: 139 mmol/L (ref 135–145)

## 2018-03-16 LAB — I-STAT TROPONIN, ED: TROPONIN I, POC: 0 ng/mL (ref 0.00–0.08)

## 2018-03-16 LAB — CBC
HCT: 38.9 % — ABNORMAL LOW (ref 39.0–52.0)
HEMOGLOBIN: 12.4 g/dL — AB (ref 13.0–17.0)
MCH: 24.3 pg — AB (ref 26.0–34.0)
MCHC: 31.9 g/dL (ref 30.0–36.0)
MCV: 76.3 fL — AB (ref 80.0–100.0)
NRBC: 0 % (ref 0.0–0.2)
Platelets: 272 10*3/uL (ref 150–400)
RBC: 5.1 MIL/uL (ref 4.22–5.81)
RDW: 13.3 % (ref 11.5–15.5)
WBC: 6.3 10*3/uL (ref 4.0–10.5)

## 2018-03-16 MED ORDER — KETOROLAC TROMETHAMINE 30 MG/ML IJ SOLN
30.0000 mg | Freq: Once | INTRAMUSCULAR | Status: AC
  Administered 2018-03-16: 30 mg via INTRAMUSCULAR
  Filled 2018-03-16: qty 1

## 2018-03-16 NOTE — Discharge Instructions (Signed)
Alternate 600 mg of ibuprofen and [phone] mg of Tylenol every 3 hours as needed for pain. Do not exceed 4000 mg of Tylenol daily.  Take ibuprofen with food to avoid upset stomach issues.  You can apply a heating pad 2-3 times daily 20 minutes at a time or take hot showers and hot baths and do some gentle stretching to avoid muscle stiffness.  Follow-up with your primary care physician for reevaluation of your symptoms if they persist.  Return to the emergency department if any concerning signs or symptoms develop such as high fevers, worsening pains, shortness of breath, persistent vomiting, or passing out.

## 2018-03-16 NOTE — ED Triage Notes (Signed)
Pt reports he initially came here to get his stitches removed from his lip but while driving here he developed right sided chest pain. No other symptoms. 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 Pt a.o, nad.

## 2018-03-16 NOTE — ED Provider Notes (Signed)
[facility] Provider Note   CSN: [id number] Arrival date & time: 03/16/18  1851     History   Chief Complaint Chief Complaint  Patient presents with  . Chest Pain  . Suture / Staple Removal    HPI Lee Fleming is a 33 y.o. male with no significant past medical history presents for evaluation of acute onset, pleuritic right-sided chest pain beginning at approximately 5 PM this evening.  He reports that pain began after finishing smoking a cigarette while drinking a beer.  Pain is described as a tightness to the right side of the chest, does not radiate.  Pain only occurs with deep inspiration and certain movements.  He notes he feels mildly short of breath due to the tightness.  He denies associated nausea, vomiting, diaphoresis, lightheadedness, abdominal pain, or syncope.  He denies leg swelling, recent travel or surgeries, hemoptysis, prior history of DVT or PE, or hormone replacement therapy.  Has not tried anything for his symptoms.  He is a current smoker but is trying to quit, denies recreational drug use or excessive alcohol intake.  He is also here for suture removal.  He had sutures placed above his upper lip 6 days ago.  Denies any swelling, abnormal drainage, fevers, or significant pain.  The history is provided by the patient.    History reviewed. No pertinent past medical history.  There are no active problems to display for this patient.   History reviewed. No pertinent surgical history.      Home Medications    Prior to Admission medications   Medication Sig Start Date End Date Taking? Authorizing Provider  traMADol (ULTRAM) 50 MG tablet Take 1 tablet (50 mg total) by mouth every 6 (six) hours as needed. 12/22/15   Coralyn MarkMitchell, Melanie L, NP    Family History No family history on file.  Social History Social History   Tobacco Use  . Smoking status: Current Every Day Smoker    Packs/day: 0.50  . Smokeless tobacco:  Never Used  Substance Use Topics  . Alcohol use: Yes    Comment: several times a week  . Drug use: No     Allergies   Patient has no known allergies.   Review of Systems Review of Systems  Constitutional: Negative for chills, diaphoresis and fever.  Respiratory: Positive for shortness of breath. Negative for cough.   Cardiovascular: Positive for chest pain.  Gastrointestinal: Negative for abdominal pain, nausea and vomiting.  Skin: Positive for wound.  Neurological: Negative for light-headedness, numbness and headaches.  All other systems reviewed and are negative.    Physical Exam Updated Vital Signs BP 122/81   Pulse (!) 56   Temp 98.3 F (36.8 C) (Oral)   Resp 16   SpO2 100%   Physical Exam Vitals signs and nursing note reviewed.  Constitutional:      General: He is not in acute distress.    Appearance: He is well-developed.  HENT:     Head: Normocephalic and atraumatic.     Comments: Well-healed upper lip laceration with 3 simple interrupted sutures in place.  No erythema, swelling, tenderness, or drainage. Eyes:     General:        Right eye: No discharge.        Left eye: No discharge.     Conjunctiva/sclera: Conjunctivae normal.  Neck:     Vascular: No JVD.     Trachea: No tracheal deviation.  Cardiovascular:  Rate and Rhythm: Normal rate and regular rhythm.     Pulses:          Carotid pulses are 2+ on the right side and 2+ on the left side.      Radial pulses are 2+ on the right side and 2+ on the left side.       Dorsalis pedis pulses are 2+ on the right side and 2+ on the left side.       Posterior tibial pulses are 2+ on the right side and 2+ on the left side.     Heart sounds: Normal heart sounds.     Comments:  Homans sign absent bilaterally, no lower extremity edema, no palpable cords, compartments are soft  Pulmonary:     Effort: Pulmonary effort is normal. No tachypnea.     Comments: Speaking in full sentences without  difficulty. Chest:     Chest wall: No mass, tenderness or crepitus.  Abdominal:     General: There is no distension.     Palpations: Abdomen is soft. There is no mass.     Tenderness: There is no abdominal tenderness.  Musculoskeletal:     Right lower leg: He exhibits no tenderness. No edema.     Left lower leg: He exhibits no tenderness. No edema.  Skin:    General: Skin is warm and dry.     Findings: No erythema.  Neurological:     Mental Status: He is alert.  Psychiatric:        Behavior: Behavior normal.      ED Treatments / Results  Labs (all labs ordered are listed, but only abnormal results are displayed) Labs Reviewed  CBC - Abnormal; Notable for the following components:      Result Value   Hemoglobin 12.4 (*)    HCT 38.9 (*)    MCV 76.3 (*)    MCH 24.3 (*)    All other components within normal limits  BASIC METABOLIC PANEL  I-STAT TROPONIN, ED    EKG None  Radiology Dg Chest 2 View  Result Date: 03/16/2018 CLINICAL DATA:  Right-sided chest pain short of breath EXAM: CHEST - 2 VIEW COMPARISON:  02/02/2008 FINDINGS: The heart size and mediastinal contours are within normal limits. Both lungs are clear. The visualized skeletal structures are unremarkable. IMPRESSION: No active cardiopulmonary disease. Electronically Signed   By: Marlan Palau M.D.   On: 03/16/2018 20:24    Procedures .Suture Removal Date/Time: 03/16/2018 10:17 PM Performed by: Jeanie Sewer, PA-C Authorized by: Jeanie Sewer, PA-C   Consent:    Consent obtained:  Verbal   Consent given by:  Patient   Risks discussed:  Bleeding, pain and wound separation   Alternatives discussed:  No treatment and delayed treatment Location:    Location:  Mouth   Mouth location:  Upper exterior lip Procedure details:    Wound appearance:  No signs of infection, good wound healing, clean, nontender and nonpurulent   Number of sutures removed:  3 Post-procedure details:    Post-removal:  No dressing  applied   Patient tolerance of procedure:  Tolerated well, no immediate complications   (including critical care time)  Medications Ordered in ED Medications  ketorolac (TORADOL) 30 MG/ML injection 30 mg (30 mg Intramuscular Given 03/16/18 2132)     Initial Impression / Assessment and Plan / ED Course  I have reviewed the triage vital signs and the nursing notes.  Pertinent labs & imaging results that were  available during my care of the patient were reviewed by me and considered in my medical decision making (see chart for details).     Patient presenting for evaluation of acute onset of right-sided pleuritic chest pain beginning a few hours prior to arrival.  Pain began after smoking a cigarette and while drinking a beer.  Pain is pleuritic but is not exertional in nature.  Cannot reproduce pain on palpation.  He is afebrile, vital signs are stable.  He is nontoxic in appearance.  No increased work of breathing noted on examination, no tachycardia or hypoxia.  EKG reassuring with no ST segment abnormality or arrhythmia.  Chest x-ray shows no acute cardiopulmonary abnormalities.  Lab work reviewed by me shows mild anemia, no metabolic derangements, leukocytosis, or renal insufficiency.  Troponin is negative and his symptoms are quite atypical.  I doubt ACS/MI.  He is PERC negative and Wells score is 0.  Low suspicion of PE.  He had significant improvement in his pain with Toradol.  Differential includes pleurisy and musculoskeletal pain.  Doubt pneumonia, CHF, dissection, cardiac tamponade, pneumothorax, or esophageal rupture.  Recommend taking anti-inflammatories and smoking cessation.  Recommend follow-up with PCP for reevaluation of symptoms.  With regards to his upper lip wound, this is healing well with no signs of secondary skin infection or abscess.  He was consented for the procedure procedure which he tolerated well.  Scar minimization and strict ED return precautions given. Pt verbalized  understanding of and agreement with plan and is safe for discharge home at this time.   Final Clinical Impressions(s) / ED Diagnoses   Final diagnoses:  Atypical chest pain  Encounter for removal of sutures    ED Discharge Orders    None       Bennye AlmFawze, Malayjah Otoole A, PA-C 03/16/18 2224    Linwood DibblesKnapp, Jon, MD 03/17/18 1531

## 2019-02-10 ENCOUNTER — Other Ambulatory Visit: Payer: Self-pay

## 2019-02-10 ENCOUNTER — Emergency Department (HOSPITAL_COMMUNITY)
Admission: EM | Admit: 2019-02-10 | Discharge: 2019-02-10 | Disposition: A | Discharge status: Home / Self Care | Payer: Self-pay | Attending: Emergency Medicine | Admitting: Emergency Medicine

## 2019-02-10 DIAGNOSIS — L0201 Cutaneous abscess of face: Secondary | ICD-10-CM | POA: Insufficient documentation

## 2019-02-10 DIAGNOSIS — L0291 Cutaneous abscess, unspecified: Secondary | ICD-10-CM

## 2019-02-10 DIAGNOSIS — F17210 Nicotine dependence, cigarettes, uncomplicated: Secondary | ICD-10-CM | POA: Insufficient documentation

## 2019-02-10 MED ORDER — CLINDAMYCIN HCL 300 MG PO CAPS: 300.0000 mg | ORAL_CAPSULE | Freq: Three times a day (TID) | ORAL | 0 refills | Status: AC

## 2019-02-10 MED ORDER — LIDOCAINE-EPINEPHRINE (PF) 2 %-1:200000 IJ SOLN
10.0000 mL | Freq: Once | INTRAMUSCULAR | Status: AC
  Administered 2019-02-10: 10 mL via INTRADERMAL
  Filled 2019-02-10: qty 20

## 2019-02-10 MED ORDER — CLINDAMYCIN HCL 300 MG PO CAPS: 300.0000 mg | ORAL_CAPSULE | Freq: Three times a day (TID) | ORAL | 0 refills | Status: DC

## 2019-02-10 NOTE — ED Notes (Signed)
Pt discharge paperwork and prescriptions reviewed with the patient. The patient verbalized understanding of both. Pt discharged. 

## 2019-02-10 NOTE — ED Provider Notes (Signed)
[facility] Provider Note   CSN: [id number] Arrival date & time: 02/10/19  1213     History   Chief Complaint No chief complaint on file.   HPI Lee Fleming is a 33 y.o. male with no significant past medical history who presents to the ED for an evaluation of worsening abscess above right eye directly below the eyebrow.  Patient notes it started out as a hard bump a few days prior and has gradually worsened. Patient notes bump is tender to touch. Patient has tried warm compresses and facial washes with no relief.  Patient denies history of cyst and abscesses.  Patient denies IV drug use.  Patient denies changes to vision, drainage from the area, fever and chills.  No past medical history on file.  There are no active problems to display for this patient.   No past surgical history on file.      Home Medications    Prior to Admission medications   Medication Sig Start Date End Date Taking? Authorizing Provider  clindamycin (CLEOCIN) 300 MG capsule Take 1 capsule (300 mg total) by mouth 3 (three) times daily for 7 days. 02/10/19 02/17/19  Cheek, Vesta Mixeraroline B, PA-C  traMADol (ULTRAM) 50 MG tablet Take 1 tablet (50 mg total) by mouth every 6 (six) hours as needed. 12/22/15   Coralyn MarkMitchell, Melanie L, NP    Family History No family history on file.  Social History Social History   Tobacco Use  . Smoking status: Current Every Day Smoker    Packs/day: 0.50  . Smokeless tobacco: Never Used  Substance Use Topics  . Alcohol use: Yes    Comment: several times a week  . Drug use: No     Allergies   Patient has no known allergies.   Review of Systems Review of Systems  Constitutional: Negative for chills and fever.  Eyes: Negative for photophobia, pain, discharge, redness, itching and visual disturbance.  Respiratory: Negative for shortness of breath.   Cardiovascular: Negative for chest pain.  Gastrointestinal: Negative for abdominal  pain.  Skin: Positive for color change and wound (cyst).     Physical Exam Updated Vital Signs BP 126/80 (BP Location: Right Arm)   Pulse 69   Temp 98.3 F (36.8 C) (Oral)   Resp 16   SpO2 100%   Physical Exam Vitals signs and nursing note reviewed.  Constitutional:      General: He is not in acute distress.    Appearance: He is not ill-appearing.  HENT:     Head: Normocephalic.  Eyes:     General:        Right eye: No discharge.        Left eye: No discharge.     Extraocular Movements: Extraocular movements intact.     Conjunctiva/sclera: Conjunctivae normal.     Pupils: Pupils are equal, round, and reactive to light.      Comments: 1x1cm abscess under right eyebrow  Neck:     Musculoskeletal: Neck supple.  Cardiovascular:     Rate and Rhythm: Normal rate and regular rhythm.     Pulses: Normal pulses.     Heart sounds: Normal heart sounds. No murmur. No friction rub. No gallop.   Pulmonary:     Effort: Pulmonary effort is normal.     Breath sounds: Normal breath sounds.  Abdominal:     General: Abdomen is flat. There is no distension.     Palpations: Abdomen is soft.  Tenderness: There is no abdominal tenderness. There is no guarding or rebound.  Musculoskeletal:     Comments: Able to move all 4 extremities without difficulty.  Skin:    General: Skin is warm.     Comments: Erythematous 1 cm x 1 cm fluctuant abscess located directly below right eyebrow with tenderness to palpation. No surrounding erythema. Mild induration around borders.  Neurological:     General: No focal deficit present.     Mental Status: He is alert.      ED Treatments / Results  Labs (all labs ordered are listed, but only abnormal results are displayed) Labs Reviewed - No data to display  EKG None  Radiology No results found.  Procedures .Marland KitchenIncision and Drainage  Date/Time: 02/10/2019 1:30 PM Performed by: Renee Harder, PA-C Authorized by: Renee Harder, PA-C    Consent:    Consent obtained:  Verbal   Consent given by:  Patient   Risks discussed:  Bleeding, incomplete drainage and pain   Alternatives discussed:  No treatment and delayed treatment Location:    Type:  Abscess   Size:  1x1cm   Location:  Head   Head location:  R eyelid Pre-procedure details:    Skin preparation:  Betadine Anesthesia (see MAR for exact dosages):    Anesthesia method:  Local infiltration   Local anesthetic:  Lidocaine 2% WITH epi Procedure type:    Complexity:  Simple Procedure details:    Needle aspiration: no     Incision types:  Stab incision and single straight   Incision depth:  Dermal   Scalpel blade:  10   Wound management:  Probed and deloculated, irrigated with saline and extensive cleaning   Drainage:  Purulent and bloody   Drainage amount:  Moderate   Wound treatment:  Wound left open   Packing materials:  None Post-procedure details:    Patient tolerance of procedure:  Tolerated well, no immediate complications   (including critical care time)  Medications Ordered in ED Medications  lidocaine-EPINEPHrine (XYLOCAINE W/EPI) 2 %-1:200000 (PF) injection 10 mL (10 mLs Intradermal Given 02/10/19 1247)     Initial Impression / Assessment and Plan / ED Course  I have reviewed the triage vital signs and the nursing notes.  Pertinent labs & imaging results that were available during my care of the patient were reviewed by me and considered in my medical decision making (see chart for details).       33 year old male presents to the ED for an evaluation of an abscess located directly below right eyebrow.  Vitals all within normal limits.  Patient is afebrile, nonseptic, non-ill appearing. Patient in no acute distress. 1x1cm abscess located below right eyebrow with tenderness over abscess. No surrounding cellulitis. No visual complaints. Abscess amenable to incision and drainage. See note above.  Abscess was not large enough to warrant packing or  drain. Given the location and close proximity to eye, will discharge home with clindamycin. No concern for deeper infection at this time. Encouraged home warm soaks and flushing. Strict ED precautions discussed with patient. Patient states understanding and agrees to plan. Patient discharged home in no acute distress and stable vitals  Final Clinical Impressions(s) / ED Diagnoses   Final diagnoses:  Abscess    ED Discharge Orders         Ordered    clindamycin (CLEOCIN) 300 MG capsule  3 times daily     02/10/19 1328  Romie Levee 02/10/19 1343    Lennice Sites, DO 02/10/19 1658

## 2019-02-10 NOTE — ED Triage Notes (Signed)
Pt woke up a few mornings ago with a red bump on his eyelid, sts it is sore. No drainage. Getting bigger each day.

## 2019-02-10 NOTE — Discharge Instructions (Signed)
As discussed, you had a small abscess located above your right eye. I was able to drain it. I am sending you home with antibiotics to help stop the spread of the infection given its right around your eye. You will take the antibiotic 3 times a day for 7 days. I have also given you the number for [facility]. I recommend calling to establish care. If your abscess gets worse or does not improve call [facility] to have wound recheck. Return to the ER for new or worsening symptoms.

## 2021-04-28 ENCOUNTER — Other Ambulatory Visit: Payer: Self-pay

## 2021-04-28 ENCOUNTER — Emergency Department (HOSPITAL_COMMUNITY): Payer: Self-pay

## 2021-04-28 ENCOUNTER — Emergency Department (HOSPITAL_COMMUNITY)
Admission: EM | Admit: 2021-04-28 | Discharge: 2021-04-28 | Disposition: A | Discharge status: Home / Self Care | Payer: Self-pay | Attending: Emergency Medicine | Admitting: Emergency Medicine

## 2021-04-28 ENCOUNTER — Ambulatory Visit: Admission: EM | Admit: 2021-04-28 | Discharge: 2021-04-28 | Payer: Self-pay

## 2021-04-28 DIAGNOSIS — R1031 Right lower quadrant pain: Secondary | ICD-10-CM | POA: Insufficient documentation

## 2021-04-28 DIAGNOSIS — Z202 Contact with and (suspected) exposure to infections with a predominantly sexual mode of transmission: Secondary | ICD-10-CM | POA: Insufficient documentation

## 2021-04-28 DIAGNOSIS — K0889 Other specified disorders of teeth and supporting structures: Secondary | ICD-10-CM

## 2021-04-28 DIAGNOSIS — R109 Unspecified abdominal pain: Secondary | ICD-10-CM

## 2021-04-28 DIAGNOSIS — K029 Dental caries, unspecified: Secondary | ICD-10-CM | POA: Insufficient documentation

## 2021-04-28 LAB — COMPREHENSIVE METABOLIC PANEL
ALT: 15 U/L (ref 0–44)
AST: 20 U/L (ref 15–41)
Albumin: 4.2 g/dL (ref 3.5–5.0)
Alkaline Phosphatase: 34 U/L — ABNORMAL LOW (ref 38–126)
Anion gap: 5 (ref 5–15)
BUN: 11 mg/dL (ref 6–20)
CO2: 29 mmol/L (ref 22–32)
Calcium: 9.3 mg/dL (ref 8.9–10.3)
Chloride: 102 mmol/L (ref 98–111)
Creatinine, Ser: 0.92 mg/dL (ref 0.61–1.24)
GFR, Estimated: >60 mL/min (ref >60)
Glucose, Bld: 84 mg/dL (ref 70–99)
Potassium: 4.7 mmol/L (ref 3.5–5.1)
Sodium: 136 mmol/L (ref 135–145)
Total Bilirubin: 0.5 mg/dL (ref 0.3–1.2)
Total Protein: 7.1 g/dL (ref 6.5–8.1)

## 2021-04-28 LAB — CBC WITH DIFFERENTIAL/PLATELET
Abs Immature Granulocytes: 0.01 10*3/uL (ref 0.00–0.07)
Basophils Absolute: 0 10*3/uL (ref 0.0–0.1)
Basophils Relative: 1 %
Eosinophils Absolute: 0.1 10*3/uL (ref 0.0–0.5)
Eosinophils Relative: 2 %
HCT: 44.7 % (ref 39.0–52.0)
Hemoglobin: 14.4 g/dL (ref 13.0–17.0)
Immature Granulocytes: 0 %
Lymphocytes Relative: 32 %
Lymphs Abs: 2 10*3/uL (ref 0.7–4.0)
MCH: 24.7 pg — ABNORMAL LOW (ref 26.0–34.0)
MCHC: 32.2 g/dL (ref 30.0–36.0)
MCV: 76.8 fL — ABNORMAL LOW (ref 80.0–100.0)
Monocytes Absolute: 0.5 10*3/uL (ref 0.1–1.0)
Monocytes Relative: 9 %
Neutro Abs: 3.6 10*3/uL (ref 1.7–7.7)
Neutrophils Relative %: 56 %
Platelets: 286 10*3/uL (ref 150–400)
RBC: 5.82 MIL/uL — ABNORMAL HIGH (ref 4.22–5.81)
RDW: 13.5 % (ref 11.5–15.5)
WBC: 6.3 10*3/uL (ref 4.0–10.5)
nRBC: 0 % (ref 0.0–0.2)

## 2021-04-28 LAB — URINALYSIS, ROUTINE W REFLEX MICROSCOPIC
Bilirubin Urine: NEGATIVE
Glucose, UA: NEGATIVE mg/dL
Hgb urine dipstick: NEGATIVE
Ketones, ur: NEGATIVE mg/dL
Leukocytes,Ua: NEGATIVE
Nitrite: NEGATIVE
Protein, ur: NEGATIVE mg/dL
Specific Gravity, Urine: 1.013 (ref 1.005–1.030)
pH: 7 (ref 5.0–8.0)

## 2021-04-28 LAB — LIPASE, BLOOD: Lipase: 31 U/L (ref 11–51)

## 2021-04-28 MED ORDER — HYDROCODONE-ACETAMINOPHEN 5-325 MG PO TABS
1.0000 | ORAL_TABLET | Freq: Once | ORAL | Status: AC
  Administered 2021-04-28: 1 via ORAL
  Filled 2021-04-28: qty 1

## 2021-04-28 MED ORDER — KETOROLAC TROMETHAMINE 15 MG/ML IJ SOLN
15.0000 mg | Freq: Once | INTRAMUSCULAR | Status: AC
  Administered 2021-04-28: 15 mg via INTRAMUSCULAR
  Filled 2021-04-28: qty 1

## 2021-04-28 MED ORDER — METHOCARBAMOL 500 MG PO TABS: 500.0000 mg | ORAL_TABLET | Freq: Two times a day (BID) | ORAL | 0 refills | Status: AC

## 2021-04-28 MED ORDER — CEFTRIAXONE SODIUM 1 G IJ SOLR
500.0000 mg | Freq: Once | INTRAMUSCULAR | Status: AC
  Administered 2021-04-28: 500 mg via INTRAMUSCULAR
  Filled 2021-04-28: qty 10

## 2021-04-28 MED ORDER — HYDROCODONE-ACETAMINOPHEN 5-325 MG PO TABS: 2.0000 | ORAL_TABLET | ORAL | 0 refills | Status: DC | PRN

## 2021-04-28 MED ORDER — LIDOCAINE HCL (PF) 1 % IJ SOLN
1.0000 mL | Freq: Once | INTRAMUSCULAR | Status: AC
  Administered 2021-04-28: 1 mL
  Filled 2021-04-28: qty 30

## 2021-04-28 MED ORDER — CLINDAMYCIN HCL 150 MG PO CAPS: 150.0000 mg | ORAL_CAPSULE | Freq: Three times a day (TID) | ORAL | 0 refills | Status: DC

## 2021-04-28 MED ORDER — HYDROCODONE-ACETAMINOPHEN 5-325 MG PO TABS: 1.0000 | ORAL_TABLET | Freq: Four times a day (QID) | ORAL | 0 refills | Status: DC | PRN

## 2021-04-28 MED ORDER — DOXYCYCLINE HYCLATE 100 MG PO TABS: 100.0000 mg | ORAL_TABLET | Freq: Two times a day (BID) | ORAL | 0 refills | Status: DC

## 2021-04-28 NOTE — Discharge Instructions (Addendum)
Please use Tylenol or ibuprofen for pain.  You may use 600 mg ibuprofen every 6 hours or 1000 mg of Tylenol every 6 hours.  You may choose to alternate between the 2.  This would be most effective.  Not to exceed 4 g of Tylenol within 24 hours.  Not to exceed 3200 mg ibuprofen 24 hours.  You can use the muscle relaxant or the narcotic in addition to the above for breakthrough pain, please use the narcotic in place of the tylenol as it already contains tylenol.  Please follow up with a dentist at your earliest convenience.  Please take a probiotic, and take your antibiotics on a full stomach to prevent stomach upset.

## 2021-04-28 NOTE — ED Triage Notes (Addendum)
Pt reports multiple complaints. Reports side pain on the right side for 3 days now. Pt denies injury to area. Pt reports breathing in and movement makes it worse. Pt also reports needing treatment for chlamydia. Pt also reports toothache after chipping tooth on the top right side.

## 2021-04-28 NOTE — ED Provider Notes (Signed)
[place] DEPT Provider Note   CSN: [id number] Arrival date & time: 04/28/21  1128     History  Chief Complaint  Patient presents with   Flank Pain   Exposure to STD   Dental Pain    Lee Fleming is a 36 y.o. male Is a 36 year old male with no significant past medical history who presents with 3 distinct concerns.  First patient with known dental caries, reports significant dental pain in the upper right side of the mouth without purulent drainage.  Patient has tried Tylenol 500 mg with minimal improvement.  Patient has also had 3 days of right-sided flank pain as well as some urinary hesitancy.  Patient denies overt dysuria, hematuria.  Patient denies history of kidney stones in the past.  He denies any nausea, vomiting, constipation, diarrhea.  Patient denies any injury to the right side.  Patient denies shortness of breath, chest pain.  Finally patient reports that he had exposure to chlamydia because he saw a woman that he was sleeping with his chlamydia medication in her car and googled it.  Patient reports that he has not had any penile discharge, foul smelling urine, or pain with sex.  Patient would like empiric treatment for chlamydia nonetheless.   Flank Pain  Exposure to STD  Dental Pain     Home Medications Prior to Admission medications   Medication Sig Start Date End Date Taking? Authorizing Provider  clindamycin (CLEOCIN) 150 MG capsule Take 1 capsule (150 mg total) by mouth 3 (three) times daily. 04/28/21  Yes Tymel Conely H, PA-C  doxycycline (VIBRA-TABS) 100 MG tablet Take 1 tablet (100 mg total) by mouth 2 (two) times daily. 04/28/21  Yes Azzan Butler H, PA-C  HYDROcodone-acetaminophen (NORCO/VICODIN) 5-325 MG tablet Take 1-2 tablets by mouth every 6 (six) hours as needed. 04/28/21  Yes Donice Alperin H, PA-C  methocarbamol (ROBAXIN) 500 MG tablet Take 1 tablet (500 mg total) by mouth 2 (two) times daily. 04/28/21   Yes Vian Fluegel H, PA-C  traMADol (ULTRAM) 50 MG tablet Take 1 tablet (50 mg total) by mouth every 6 (six) hours as needed. 12/22/15   Marney Setting, NP      Allergies    Iodine    Review of Systems   Review of Systems  HENT:  Positive for dental problem.   Genitourinary:  Positive for flank pain.  All other systems reviewed and are negative.  Physical Exam Updated Vital Signs BP 113/77    Pulse 69    Temp 98.3 F (36.8 C) (Oral)    Resp 18    SpO2 100%  Physical Exam Vitals and nursing note reviewed.  Constitutional:      General: He is not in acute distress.    Appearance: Normal appearance.  HENT:     Head: Normocephalic and atraumatic.     Mouth/Throat:     Comments: Poor dentition throughout the mouth, noted dental caries, redness and inflammation the right upper jaw with no evidence of abscess noted.  Tonsils are 1+ bilaterally, no peritonsillar abscess noted, uvula midline. Eyes:     General:        Right eye: No discharge.        Left eye: No discharge.  Cardiovascular:     Rate and Rhythm: Normal rate and regular rhythm.     Heart sounds: No murmur heard.   No friction rub. No gallop.  Pulmonary:     Effort: Pulmonary effort is normal.  Breath sounds: Normal breath sounds.  Abdominal:     General: Bowel sounds are normal.     Palpations: Abdomen is soft.  Genitourinary:    Comments: Declined genital exam as he is not having any symptoms at this time. Musculoskeletal:     Comments: To palpation over the right flank overlying the right ribs.  No significant tenderness to palpation lower than this in the abdomen.  Skin:    General: Skin is warm and dry.     Capillary Refill: Capillary refill takes less than 2 seconds.  Neurological:     Mental Status: He is alert and oriented to person, place, and time.  Psychiatric:        Mood and Affect: Mood normal.        Behavior: Behavior normal.    ED Results / Procedures / Treatments   Labs (all  labs ordered are listed, but only abnormal results are displayed) Labs Reviewed  CBC WITH DIFFERENTIAL/PLATELET - Abnormal; Notable for the following components:      Result Value   RBC 5.82 (*)    MCV 76.8 (*)    MCH 24.7 (*)    All other components within normal limits  COMPREHENSIVE METABOLIC PANEL - Abnormal; Notable for the following components:   Alkaline Phosphatase 34 (*)    All other components within normal limits  URINALYSIS, ROUTINE W REFLEX MICROSCOPIC - Abnormal; Notable for the following components:   APPearance CLOUDY (*)    All other components within normal limits  LIPASE, BLOOD  GC/CHLAMYDIA PROBE AMP ([place]) NOT AT [facility]    EKG None  Radiology CT Renal Stone Study  Result Date: 04/28/2021 CLINICAL DATA:  Right flank pain, STI EXAM: CT ABDOMEN AND PELVIS WITHOUT CONTRAST TECHNIQUE: Multidetector CT imaging of the abdomen and pelvis was performed following the standard protocol without IV contrast. RADIATION DOSE REDUCTION: This exam was performed according to the departmental dose-optimization program which includes automated exposure control, adjustment of the mA and/or kV according to patient size and/or use of iterative reconstruction technique. COMPARISON:  08/28/2006 FINDINGS: Lower chest: Included lung bases are clear.  Heart size is normal. Hepatobiliary: Unremarkable unenhanced appearance of the liver. No focal liver lesion identified. Gallbladder within normal limits. No hyperdense gallstone. No biliary dilatation. Pancreas: Unremarkable. No pancreatic ductal dilatation or surrounding inflammatory changes. Spleen: Normal in size without focal abnormality. Adrenals/Urinary Tract: Adrenal glands are unremarkable. Kidneys are normal, without renal calculi, focal lesion, or hydronephrosis. Mild circumferential urinary bladder wall thickening, which may be accentuated by under-distension. Stomach/Bowel: Stomach is within normal limits. Appendix appears normal  (series 3, image 55). No evidence of bowel wall thickening, distention, or inflammatory changes. Vascular/Lymphatic: No significant vascular findings are present. No enlarged abdominal or pelvic lymph nodes. Reproductive: Prostate is unremarkable. Other: No free fluid. No abdominopelvic fluid collection. No pneumoperitoneum. No abdominal wall hernia. Musculoskeletal: No acute or significant osseous findings. IMPRESSION: 1. Mild circumferential urinary bladder wall thickening, which may be accentuated by under-distension. Correlate with urinalysis to exclude cystitis. 2. Otherwise, no acute abdominopelvic findings. Specifically, no evidence of obstructive uropathy. Electronically Signed   By: Davina Poke D.O.   On: 04/28/2021 13:21    Procedures Procedures    Medications Ordered in ED Medications  ketorolac (TORADOL) 15 MG/ML injection 15 mg (15 mg Intramuscular Given 04/28/21 1300)  HYDROcodone-acetaminophen (NORCO/VICODIN) 5-325 MG per tablet 1 tablet (1 tablet Oral Given 04/28/21 1300)  cefTRIAXone (ROCEPHIN) injection 500 mg (500 mg Intramuscular Given 04/28/21 1416)  lidocaine (PF) (XYLOCAINE) 1 % injection 1 mL (1 mL Other Given 04/28/21 1416)    ED Course/ Medical Decision Making/ A&P                            Medical Decision Making Amount and/or Complexity of Data Reviewed Labs: ordered. Radiology: ordered.  Risk Prescription drug management.   Patient with multiple complaints.  First he has some acute onset right flank pain that has been present for the last 4 days.  He denies any dysuria, hematuria but he does have some hesitancy.  He denies any nausea, vomiting, diarrhea, constipation.  My differential diagnosis on emergent basis includes acute pyelonephritis, nephrolithiasis, referred right upper quadrant pain, gallbladder dysfunction, cholecystitis, cholangitis, musculoskeletal pain/muscle spasm versus other.  This is not an exhaustive differential.  Secondly patient  reports that he has right-sided dental pain, with known dental caries.  My differential diagnosis on emergent basis includes peritonsillar abscess, dental abscess, versus simple dental pain/dental infection without abscess.  Finally patient reports exposure to a partner that was being treated for chlamydia with no penile discharge or other symptoms.   I personally interpreted lab work which is overall unremarkable including unremarkable CBC, CMP, lipase.  Unremarkable urinalysis.  His GC and chlamydia probe is pending at this time.  I personally interpreted the CT renal stone study which shows no evidence of nephrolithiasis or other intra-abdominal abnormality.  Discussed with patient that I believe his flank pain is consistent with musculoskeletal pain, and we will consider a trial of ibuprofen, Tylenol, muscle relaxant.  Next we will treat patient's dental infection with clindamycin, as well as pain medication.  Encourage close follow-up with the dentist.  Finally patient requests treatment for gonorrhea, chlamydia as he had a partner who is being treated and known exposure.  Administered Rocephin, discharged with doxycycline prescription.  Patient is discharged in stable condition at this time, return precautions given. Final Clinical Impression(s) / ED Diagnoses Final diagnoses:  Pain, dental  Exposure to STD  Right flank pain    Rx / DC Orders ED Discharge Orders          Ordered    doxycycline (VIBRA-TABS) 100 MG tablet  2 times daily        04/28/21 1353    methocarbamol (ROBAXIN) 500 MG tablet  2 times daily        04/28/21 1356    HYDROcodone-acetaminophen (NORCO/VICODIN) 5-325 MG tablet  Every 4 hours PRN,   Status:  Discontinued        04/28/21 1356    clindamycin (CLEOCIN) 150 MG capsule  3 times daily        04/28/21 1356    HYDROcodone-acetaminophen (NORCO/VICODIN) 5-325 MG tablet  Every 6 hours PRN        04/28/21 1501              Annabel Gibeau, Jackalyn Lombard 04/28/21 1529    Luna Fuse, MD 05/04/21 [phone]

## 2021-04-29 LAB — GC/CHLAMYDIA PROBE AMP (~~LOC~~) NOT AT ARMC
Chlamydia: NEGATIVE
Comment: NEGATIVE
Comment: NORMAL
Neisseria Gonorrhea: NEGATIVE

## 2021-06-06 ENCOUNTER — Encounter (HOSPITAL_COMMUNITY): Payer: Self-pay

## 2021-06-06 ENCOUNTER — Emergency Department (HOSPITAL_COMMUNITY): Payer: Self-pay

## 2021-06-06 ENCOUNTER — Other Ambulatory Visit: Payer: Self-pay

## 2021-06-06 ENCOUNTER — Emergency Department (HOSPITAL_COMMUNITY)
Admission: EM | Admit: 2021-06-06 | Discharge: 2021-06-06 | Disposition: A | Discharge status: Home / Self Care | Payer: Self-pay | Attending: Emergency Medicine | Admitting: Emergency Medicine

## 2021-06-06 DIAGNOSIS — R1084 Generalized abdominal pain: Secondary | ICD-10-CM | POA: Insufficient documentation

## 2021-06-06 DIAGNOSIS — R197 Diarrhea, unspecified: Secondary | ICD-10-CM | POA: Insufficient documentation

## 2021-06-06 LAB — COMPREHENSIVE METABOLIC PANEL
ALT: 20 U/L (ref 0–44)
AST: 28 U/L (ref 15–41)
Albumin: 4.3 g/dL (ref 3.5–5.0)
Alkaline Phosphatase: 36 U/L — ABNORMAL LOW (ref 38–126)
Anion gap: 8 (ref 5–15)
BUN: 9 mg/dL (ref 6–20)
CO2: 25 mmol/L (ref 22–32)
Calcium: 9.2 mg/dL (ref 8.9–10.3)
Chloride: 102 mmol/L (ref 98–111)
Creatinine, Ser: 1.1 mg/dL (ref 0.61–1.24)
GFR, Estimated: >60 mL/min (ref >60)
Glucose, Bld: 103 mg/dL — ABNORMAL HIGH (ref 70–99)
Potassium: 3.9 mmol/L (ref 3.5–5.1)
Sodium: 135 mmol/L (ref 135–145)
Total Bilirubin: 1.1 mg/dL (ref 0.3–1.2)
Total Protein: 7.4 g/dL (ref 6.5–8.1)

## 2021-06-06 LAB — CBC
HCT: 40.6 % (ref 39.0–52.0)
Hemoglobin: 13.3 g/dL (ref 13.0–17.0)
MCH: 24.9 pg — ABNORMAL LOW (ref 26.0–34.0)
MCHC: 32.8 g/dL (ref 30.0–36.0)
MCV: 76 fL — ABNORMAL LOW (ref 80.0–100.0)
Platelets: 266 10*3/uL (ref 150–400)
RBC: 5.34 MIL/uL (ref 4.22–5.81)
RDW: 13.6 % (ref 11.5–15.5)
WBC: 9.9 10*3/uL (ref 4.0–10.5)
nRBC: 0 % (ref 0.0–0.2)

## 2021-06-06 LAB — LIPASE, BLOOD: Lipase: 27 U/L (ref 11–51)

## 2021-06-06 MED ORDER — SODIUM CHLORIDE 0.9 % IV BOLUS
1000.0000 mL | Freq: Once | INTRAVENOUS | Status: AC
  Administered 2021-06-06: 1000 mL via INTRAVENOUS

## 2021-06-06 MED ORDER — DICYCLOMINE HCL 10 MG/ML IM SOLN
20.0000 mg | Freq: Once | INTRAMUSCULAR | Status: AC
  Administered 2021-06-06: 20 mg via INTRAMUSCULAR
  Filled 2021-06-06: qty 2

## 2021-06-06 MED ORDER — ONDANSETRON HCL 4 MG/2ML IJ SOLN
4.0000 mg | Freq: Once | INTRAMUSCULAR | Status: AC
  Administered 2021-06-06: 4 mg via INTRAVENOUS
  Filled 2021-06-06: qty 2

## 2021-06-06 MED ORDER — DICYCLOMINE HCL 20 MG PO TABS: 20.0000 mg | ORAL_TABLET | Freq: Two times a day (BID) | ORAL | 0 refills | Status: AC

## 2021-06-06 MED ORDER — DICYCLOMINE HCL 20 MG PO TABS: 20.0000 mg | ORAL_TABLET | Freq: Two times a day (BID) | ORAL | 0 refills | Status: DC

## 2021-06-06 MED ORDER — ONDANSETRON HCL 4 MG PO TABS: 4.0000 mg | ORAL_TABLET | Freq: Three times a day (TID) | ORAL | 0 refills | Status: DC | PRN

## 2021-06-06 NOTE — ED Provider Notes (Signed)
?[facility]-EMERGENCY DEPT ?Provider Note ? ? ?CSN: [id number] ?Arrival date & time: 06/06/21  1153 ? ?  ? ?History ? ?Chief Complaint  ?Patient presents with  ? Abdominal Pain  ? Diarrhea  ? ? ?Eathon Valade is a 36 y.o. male.Jadie Comas is a 36 y.o. male.  Patient presents with approximately 1 day of severe generalized abdominal pain and diarrhea.  Patient denies nausea or vomiting.  Patient denies chest pain, denies shortness of breath.  States that at approximately 4 PM yesterday he started having severe abdominal pain followed by diarrhea.  He had multiple bouts of diarrhea until approximately 2 AM.  He was able to sleep the rest the night getting up for work and that the abdominal pain and diarrhea resumed.  No relevant past medical history ? ?HPI ? ?  ? ?Home Medications ?Prior to Admission medications   ?Medication Sig Start Date End Date Taking? Authorizing Provider  ?dicyclomine (BENTYL) 20 MG tablet Take 1 tablet (20 mg total) by mouth 2 (two) times daily. 06/06/21  Yes Darrick Grinder, PA-C  ?ondansetron (ZOFRAN) 4 MG tablet Take 1 tablet (4 mg total) by mouth every 8 (eight) hours as needed for nausea or vomiting. 06/06/21  Yes Darrick Grinder, PA-C  ?clindamycin (CLEOCIN) 150 MG capsule Take 1 capsule (150 mg total) by mouth 3 (three) times daily. 04/28/21   Prosperi, Christian H, PA-C  ?doxycycline (VIBRA-TABS) 100 MG tablet Take 1 tablet (100 mg total) by mouth 2 (two) times daily. 04/28/21   Prosperi, Christian H, PA-C  ?HYDROcodone-acetaminophen (NORCO/VICODIN) 5-325 MG tablet Take 1-2 tablets by mouth every 6 (six) hours as needed. 04/28/21   Prosperi, Christian H, PA-C  ?methocarbamol (ROBAXIN) 500 MG tablet Take 1 tablet (500 mg total) by mouth 2 (two) times daily. 04/28/21   Prosperi, Christian H, PA-C  ?traMADol (ULTRAM) 50 MG tablet Take 1 tablet (50 mg total) by mouth every 6 (six) hours as needed. 12/22/15   Coralyn Mark, NP  ?   ? ?Allergies    ?Iodine    ? ?Review of Systems   ?Review of Systems  ?Constitutional:  Negative for fever.  ?Respiratory:  Negative for shortness of breath.   ?Cardiovascular:  Negative for chest pain.  ?Gastrointestinal:  Positive for abdominal pain and diarrhea. Negative for nausea and vomiting.  ? ?Physical Exam ?Updated Vital Signs ?BP 102/62   Pulse 81   Temp 98 ?F (36.7 ?C)   Resp 18   Ht 5\' 9"  (1.753 m)   Wt 63.5 kg   SpO2 100%   BMI 20.67 kg/m?  ?Physical Exam ?Vitals and nursing note reviewed.  ?Constitutional:   ?   Appearance: He is normal weight.  ?HENT:  ?   Head: Normocephalic.  ?Eyes:  ?   Extraocular Movements: Extraocular movements intact.  ?Cardiovascular:  ?   Rate and Rhythm: Normal rate and regular rhythm.  ?   Heart sounds: Normal heart sounds.  ?Pulmonary:  ?   Effort: Pulmonary effort is normal.  ?   Breath sounds: Normal breath sounds.  ?Abdominal:  ?   General: Abdomen is flat. Bowel sounds are increased. There is no distension.  ?   Palpations: Abdomen is soft.  ?   Tenderness: There is generalized abdominal tenderness.  ?Skin: ?   General: Skin is warm and dry.  ?Neurological:  ?   Mental Status: He is alert.  ? ? ?ED Results / Procedures / Treatments   ?Labs ?(all labs  ordered are listed, but only abnormal results are displayed) ?Labs Reviewed  ?COMPREHENSIVE METABOLIC PANEL - Abnormal; Notable for the following components:  ?    Result Value  ? Glucose, Bld 103 (*)   ? Alkaline Phosphatase 36 (*)   ? All other components within normal limits  ?CBC - Abnormal; Notable for the following components:  ? MCV 76.0 (*)   ? MCH 24.9 (*)   ? All other components within normal limits  ?LIPASE, BLOOD  ?URINALYSIS, ROUTINE W REFLEX MICROSCOPIC  ? ? ?EKG ?None ? ?Radiology ?CT Abdomen Pelvis Wo Contrast ? ?Result Date: 06/06/2021 ?CLINICAL DATA:  Abdominal pain.  Mid abdominal pain and diarrhea EXAM: CT ABDOMEN AND PELVIS WITHOUT CONTRAST TECHNIQUE: Multidetector CT imaging of the abdomen and pelvis was performed  following the standard protocol without IV contrast. RADIATION DOSE REDUCTION: This exam was performed according to the departmental dose-optimization program which includes automated exposure control, adjustment of the mA and/or kV according to patient size and/or use of iterative reconstruction technique. COMPARISON:  CT 04/28/2021 FINDINGS: Lower chest: Lung bases are clear. Hepatobiliary: No focal hepatic lesion. No biliary duct dilatation. Common bile duct is normal. Pancreas: Pancreas is normal. No ductal dilatation. No pancreatic inflammation. Spleen: Normal spleen Adrenals/urinary tract: Adrenal glands and kidneys are normal. No nephrolithiasis, ureterolithiasis or obstructive uropathy. The ureters and bladder normal. Stomach/Bowel: Stomach, small bowel, appendix, and cecum are normal. The colon and rectosigmoid colon are normal. Vascular/Lymphatic: Abdominal aorta is normal caliber. No periportal or retroperitoneal adenopathy. No pelvic adenopathy. Reproductive: Prostate unremarkable Other: Small amount free fluid in the deep pelvis which is simple fluid attenuation Musculoskeletal: No aggressive osseous lesion. IMPRESSION: 1. No acute findings abdomen pelvis. 2. Some limitation due to no IV or oral contrast and very little intra-abdominal fat. 3. No change from CT 04/28/2021. 4. Small amount free fluid the pelvis without clear etiology Electronically Signed   By: Genevive Bi M.D.   On: 06/06/2021 14:45   ? ?Procedures ?Procedures  ? ?Medications Ordered in ED ?Medications  ?ondansetron (ZOFRAN) injection 4 mg (4 mg Intravenous Given 06/06/21 1411)  ?sodium chloride 0.9 % bolus 1,000 mL (1,000 mLs Intravenous New Bag/Given (Non-Interop) 06/06/21 1410)  ?dicyclomine (BENTYL) injection 20 mg (20 mg Intramuscular Given 06/06/21 1411)  ? ? ?ED Course/ Medical Decision Making/ A&P ?  ?                        ?Medical Decision Making ?Amount and/or Complexity of Data Reviewed ?Labs: ordered. ?Radiology:  ordered. ? ?Risk ?Prescription drug management. ? ? ?This patient presents to the ED for concern of abdominal pain, this involves an extensive number of treatment options, and is a complaint that carries with it a high risk of complications and morbidity.  The differential diagnosis includes gastroenteritis, appendicitis, cholecystitis, and others ? ? ?Co morbidities that complicate the patient evaluation ? ?None ? ? ?Additional history obtained: ? ?Additional history obtained from N/A ?External records from outside source obtained and reviewed including ED notes from April 28, 2021 ? ? ?Lab Tests: ? ?I Ordered, and personally interpreted labs.  The pertinent results include: None ? ? ?Imaging Studies ordered: ? ?I ordered imaging studies including CT abdomen pelvis without contrast ?I independently visualized and interpreted imaging which showed no acute disease ?I agree with the radiologist interpretation ? ? ?Problem List / ED Course / Critical interventions / Medication management ? ?I ordered medication including bentyl for abdominal cramping, zofran for  nausesa  ?Reevaluation of the patient after these medicines showed that the patient improved ?I have reviewed the patients home medicines and have made adjustments as needed ? ? ?Test / Admission - Considered: ? ?Labs and CT were unremarkable. Lipase was normal. Pancreatitis unlikely. No specifiic RUQ pain. Cholecystitis unlikely. No point tenderness in RLQ, no guarding, no rebound tenderness. Appendicitis unlikely.   ? ?I believe this is likely a viral infection. I discussed this with the patient. There is no indication at this time for admission. The patient is tolerating PO liquids.  Discharge home with Bentyl and Zofran.  ? ?Final Clinical Impression(s) / ED Diagnoses ?Final diagnoses:  ?Generalized abdominal pain  ?Diarrhea, unspecified type  ? ? ?Rx / DC Orders ?ED Discharge Orders   ? ?      Ordered  ?  dicyclomine (BENTYL) 20 MG tablet  2 times daily        ? 06/06/21 1555  ?  ondansetron (ZOFRAN) 4 MG tablet  Every 8 hours PRN       ? 06/06/21 1555  ? ?  ?  ? ?  ? ? ?  ?Darrick GrinderMcCauley, Dennette Faulconer B, PA-C ?06/06/21 1555 ? ?  ?Virgina NorfolkCuratolo, Adam, DO ?06/06/21 1608 ? ?

## 2021-06-06 NOTE — ED Triage Notes (Signed)
Patient c/o mid abdominal pain and diarrhea since 0200 today. ?

## 2021-06-06 NOTE — Discharge Instructions (Addendum)
You were seen today for abdominal pain and diarrhea. This is likely a viral infection. I have prescribed medication for abdominal pain and nausea. You may buy anti-diarrheal medication over the counter. Return if your pain increases, you notice frequent bloody stools, or if you become unable to tolerate oral liquids.  ?

## 2021-06-22 ENCOUNTER — Emergency Department (HOSPITAL_COMMUNITY)
Admission: EM | Admit: 2021-06-22 | Discharge: 2021-06-22 | Disposition: A | Discharge status: Home / Self Care | Payer: Self-pay | Attending: Emergency Medicine | Admitting: Emergency Medicine

## 2021-06-22 ENCOUNTER — Encounter (HOSPITAL_COMMUNITY): Payer: Self-pay | Admitting: Emergency Medicine

## 2021-06-22 ENCOUNTER — Emergency Department (HOSPITAL_COMMUNITY): Payer: Self-pay

## 2021-06-22 DIAGNOSIS — S62314A Displaced fracture of base of fourth metacarpal bone, right hand, initial encounter for closed fracture: Secondary | ICD-10-CM | POA: Insufficient documentation

## 2021-06-22 DIAGNOSIS — M7989 Other specified soft tissue disorders: Secondary | ICD-10-CM | POA: Insufficient documentation

## 2021-06-22 MED ORDER — OXYCODONE-ACETAMINOPHEN 5-325 MG PO TABS
2.0000 | ORAL_TABLET | Freq: Once | ORAL | Status: AC
  Administered 2021-06-22: 2 via ORAL
  Filled 2021-06-22: qty 2

## 2021-06-22 MED ORDER — HYDROCODONE-ACETAMINOPHEN 5-325 MG PO TABS
1.0000 | ORAL_TABLET | Freq: Once | ORAL | Status: AC
  Administered 2021-06-22: 1 via ORAL
  Filled 2021-06-22: qty 1

## 2021-06-22 MED ORDER — OXYCODONE-ACETAMINOPHEN 5-325 MG PO TABS: 1.0000 | ORAL_TABLET | Freq: Four times a day (QID) | ORAL | 0 refills | Status: DC | PRN

## 2021-06-22 NOTE — Progress Notes (Signed)
Orthopedic Tech Progress Note ?Patient Details:  ?Athens Lebeau ?09/28/1985 ?[id number] ? ?Ortho Devices ?Type of Ortho Device: Ace wrap, Ulna gutter splint ?Ortho Device/Splint Location: right ?Ortho Device/Splint Interventions: Application ?  ?Post Interventions ?Patient Tolerated: Well ?Instructions Provided: Care of device ? ?Saul Fordyce ?06/22/2021, 9:02 PM ? ?
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
## 2021-06-22 NOTE — ED Notes (Signed)
Ice place on patient and ortho called for splint.  ?

## 2021-06-22 NOTE — Discharge Instructions (Addendum)
You were evaluated in the Emergency Department and after careful evaluation, we did not find any emergent condition requiring admission or further testing in the hospital. ? ?Your exam/testing today was concerning for fractures of 2 bones in your hands.  You have been treated with splinting.  Recommend rest, elevation of the affected extremity, intermittent icing, NSAIDs and opiates for pain control.  Follow-up with hand surgery, Dr. Frazier Butt in clinic.  Call to schedule an appointment tomorrow. ? ?Please return to the Emergency Department if you experience any worsening of your condition.  Thank you for allowing [place] to be a part of your care. ? ?

## 2021-06-27 ENCOUNTER — Ambulatory Visit (INDEPENDENT_AMBULATORY_CARE_PROVIDER_SITE_OTHER): Payer: Self-pay | Admitting: Orthopedic Surgery

## 2021-06-27 DIAGNOSIS — S62318A Displaced fracture of base of other metacarpal bone, initial encounter for closed fracture: Secondary | ICD-10-CM | POA: Insufficient documentation

## 2021-06-27 DIAGNOSIS — S62342A Nondisplaced fracture of base of third metacarpal bone, right hand, initial encounter for closed fracture: Secondary | ICD-10-CM

## 2021-06-27 DIAGNOSIS — S62314A Displaced fracture of base of fourth metacarpal bone, right hand, initial encounter for closed fracture: Secondary | ICD-10-CM | POA: Insufficient documentation

## 2021-06-27 DIAGNOSIS — S62344A Nondisplaced fracture of base of fourth metacarpal bone, right hand, initial encounter for closed fracture: Secondary | ICD-10-CM
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
## 2021-06-28 ENCOUNTER — Other Ambulatory Visit (HOSPITAL_BASED_OUTPATIENT_CLINIC_OR_DEPARTMENT_OTHER): Payer: Self-pay | Admitting: Orthopaedic Surgery

## 2021-06-28 MED ORDER — OXYCODONE-ACETAMINOPHEN 5-325 MG PO TABS: 1.0000 | ORAL_TABLET | Freq: Four times a day (QID) | ORAL | 0 refills | Status: DC | PRN

## 2021-06-29 ENCOUNTER — Other Ambulatory Visit (HOSPITAL_BASED_OUTPATIENT_CLINIC_OR_DEPARTMENT_OTHER): Payer: Self-pay | Admitting: Orthopaedic Surgery

## 2021-06-29 MED ORDER — OXYCODONE-ACETAMINOPHEN 5-325 MG PO TABS: 1.0000 | ORAL_TABLET | Freq: Four times a day (QID) | ORAL | 0 refills | Status: AC | PRN

## 2021-07-02 ENCOUNTER — Encounter: Payer: Self-pay | Admitting: Rehabilitative and Restorative Service Providers"

## 2021-07-02 ENCOUNTER — Other Ambulatory Visit: Payer: Self-pay

## 2021-07-02 ENCOUNTER — Ambulatory Visit (INDEPENDENT_AMBULATORY_CARE_PROVIDER_SITE_OTHER): Payer: Self-pay | Admitting: Rehabilitative and Restorative Service Providers"

## 2021-07-02 DIAGNOSIS — R278 Other lack of coordination: Secondary | ICD-10-CM

## 2021-07-02 DIAGNOSIS — M25641 Stiffness of right hand, not elsewhere classified: Secondary | ICD-10-CM

## 2021-07-02 DIAGNOSIS — M25631 Stiffness of right wrist, not elsewhere classified: Secondary | ICD-10-CM

## 2021-07-02 DIAGNOSIS — M79641 Pain in right hand: Secondary | ICD-10-CM

## 2021-07-02 DIAGNOSIS — R600 Localized edema: Secondary | ICD-10-CM

## 2021-07-02 DIAGNOSIS — M6281 Muscle weakness (generalized): Secondary | ICD-10-CM

## 2021-07-02 DIAGNOSIS — M25531 Pain in right wrist: Secondary | ICD-10-CM
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
## 2021-07-11 ENCOUNTER — Ambulatory Visit (INDEPENDENT_AMBULATORY_CARE_PROVIDER_SITE_OTHER): Payer: Self-pay

## 2021-07-11 ENCOUNTER — Ambulatory Visit (INDEPENDENT_AMBULATORY_CARE_PROVIDER_SITE_OTHER): Payer: Self-pay | Admitting: Orthopedic Surgery

## 2021-07-11 DIAGNOSIS — S62344A Nondisplaced fracture of base of fourth metacarpal bone, right hand, initial encounter for closed fracture: Secondary | ICD-10-CM

## 2021-07-11 DIAGNOSIS — S62342A Nondisplaced fracture of base of third metacarpal bone, right hand, initial encounter for closed fracture: Secondary | ICD-10-CM
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
## 2021-07-14 ENCOUNTER — Ambulatory Visit (INDEPENDENT_AMBULATORY_CARE_PROVIDER_SITE_OTHER): Payer: Medicaid Other | Admitting: Rehabilitative and Restorative Service Providers"

## 2021-07-14 ENCOUNTER — Encounter: Payer: Self-pay | Admitting: Rehabilitative and Restorative Service Providers"

## 2021-07-14 DIAGNOSIS — M79641 Pain in right hand: Secondary | ICD-10-CM

## 2021-07-14 DIAGNOSIS — M25641 Stiffness of right hand, not elsewhere classified: Secondary | ICD-10-CM

## 2021-07-14 DIAGNOSIS — R600 Localized edema: Secondary | ICD-10-CM

## 2021-07-14 DIAGNOSIS — M25531 Pain in right wrist: Secondary | ICD-10-CM

## 2021-07-14 DIAGNOSIS — M25631 Stiffness of right wrist, not elsewhere classified: Secondary | ICD-10-CM

## 2021-07-14 DIAGNOSIS — M6281 Muscle weakness (generalized): Secondary | ICD-10-CM

## 2021-07-14 DIAGNOSIS — R278 Other lack of coordination: Secondary | ICD-10-CM

## 2021-07-14 NOTE — Therapy (Signed)
?OUTPATIENT OCCUPATIONAL THERAPY TREATMENT NOTE ? ? ?Patient Name: Lee Fleming ?MRN: [id number] ?DOB:Sep 03, 1985, 36 y.o., male ?Today's Date: 07/14/2021 ? ?PCP: Patient, [name] (Inactive) ?REFERRING PROVIDER: Sherilyn Cooter, MD ? ?END OF SESSION:  ? OT End of Session - 07/14/21 1156   ? ? Visit Number 2   ? Number of Visits 12   ? Date for OT Re-Evaluation 09/26/21   ? Authorization Type self-pay   ? OT Start Time 1109   ? OT Stop Time 1155   ? OT Time Calculation (min) 46 min   ? Equipment Utilized During Treatment orthotic materials   ? Activity Tolerance Patient tolerated treatment well;No increased pain;Patient limited by pain   ? Behavior During Therapy [facility] for tasks assessed/performed   ? ?  ?  ? ?  ? ? ?History reviewed. No pertinent past medical history. ?History reviewed. No pertinent surgical history. ?Patient Active Problem List  ? Diagnosis Date Noted  ? Closed fracture of base of fourth metacarpal bone of right hand 06/27/2021  ? Closed fracture of base of third metacarpal bone 06/27/2021  ? ? ?ONSET DATE: 06/22/21 DOI ?  ?REFERRING DIAG:  ?Z79.150V (ICD-10-CM) - Closed nondisplaced fracture of base of fourth metacarpal bone of right hand, initial encounter  ?W97.948A (ICD-10-CM) - Closed nondisplaced fracture of base of third metacarpal bone of right hand, initial encounter  ? ? ?THERAPY DIAG:  ?Localized edema ? ?Muscle weakness (generalized) ? ?Other lack of coordination ? ?Stiffness of right hand, not elsewhere classified ? ?Pain in right hand ? ?Pain in right wrist ? ?Stiffness of right wrist, not elsewhere classified ? ? ?PERTINENT HISTORY: Per MD: "Right 3rd, 4th MC base fractures.  Non op.  P1 blocking splint, edema control, etc." ?  ?PRECAUTIONS: 3 week post injury now, NWB in right hand, Start light wrist AROM at weeks 3-4 as tol, 5-6 weeks start finger AA/ROM and P/ROM as tol, week 6 may start light wrist PROM and consider cutting down orthotic to leave IP Js free, d/c orthosis between  7-8 weeks as tolerated (except for heavy tasks) and start hand strength, 10-12 weeks return to use of hand with activities  ? ?SUBJECTIVE:  ?He states orthotic rubbing in multiple spots, MD visit last week went well. This is his first f/u for 2 weeks in therapy. His arm is dirty, he hasn't taken off brace of sleeve at all, it seems like.  ? ?PAIN:  ?Are you having pain? Yes ?Rating: 5-6/10 today at rest, and no higher than 6/10 in past 2 weeks ? ? ?OBJECTIVE: (All objective assessments below are from initial evaluation on: 07/02/21 unless otherwise specified.)  ? ?HAND DOMINANCE: Right ?  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 for now.  ?  ?  ?FUNCTIONAL OUTCOME MEASURES: ?Quick Dash: TBD ?  ?UE ROM   07/02/21: Rt hand cannot make full fist, fingers stiff, cannot oppose thumb to MF, RF, or SF today.   ?  ?Active ROM Right ?07/02/2021  ?Elbow flexion ~140  ?Elbow extension ~(-20)  ?Wrist flexion    ?Wrist extension    ?Wrist ulnar deviation    ?Wrist radial deviation    ?Wrist pronation ~80*  ?Wrist supination ~25*  ?(Blank rows = not tested) ?  ?  ?UE MMT:   NT at eval due to fx  ?  ?HAND FUNCTION: ?TBD grip and pinch strength when safe ?  ?COORDINATION: ?Impaired in Rt hand and arm  now grossly and with FMS as well due to inability to oppose or make full fist ?  ?SENSATION: ?C/o slight decreased in and around thumb now, but LT intact  ?  ?EDEMA: mild diffuse swelling about hand and wrist now ?  ?COGNITION: ?Overall cognitive status: Within functional limits for tasks assessed ?  ?  ?OBSERVATIONS:  ?07/14/21: arm and brace are both dirty and smell rank.  ? ?Eval: fearful to move elbow, FA, or fingers now, and holding up arm may make shoulder sore/stiff- he is educated against and shown better ways to keep hand elevated.  ? ?  ?  ?TODAY'S TREATMENT:  ?07/14/21: OT adjusts orthotic around thumb, forearm and fingers where it was apply some pressure. OT also has to reattach  all self-adhesive velcro today and supply all new straps.  He is edu for self-care to now wash arm at least 1xday and is given new stockinette, edu to wash this and switch to keep clean. He states understanding and is able to wash his arm without much pain, with OT guidance.  He also reviews basic HEP and performs AROM at sh, elbow, and gently in forearm.  Due to healing seen on x-ray, OT also has him start very light AROM at fingers and wrist flex/ext with orthotic off, and this was able to be done with no pain today. He is cautioned to "back off" or stop if this becomes painful at all. He states understanding and leaves with no significant pain.  ? ?07/02/21 Eval: Custom orthotic fabrication was indicated due to pt's healing metacarpal fxs and need for safe, functional positioning. OT fabricated custom clamshell wrist and MCP immobilization orthotic for pt today to support right hand/wrist. MCP Js flexed to ~45*, wrist in near neutral, IP Js free. It fit well with no areas of pressure, pt states a comfortable fit. Pt was educated on the wearing schedule, to call or come in ASAP if it is causing any irritation or is not achieving desired function. It will be checked/adjusted in upcoming sessions, as needed. Pt states understanding.  ?  ?He was also edu briefly on home safety, avoiding any lifting or functional use of hand for now. To not remove brace at all (wear bag over in shower). To do the following exercises as tolerated. He demo's back well, no significant increase in pain today. 2 ?  ?Exercises ?- Standing Elbow Flexion Extension AROM  - 4-6 x daily - 10-15 reps ?- Seated Forearm Pronation and Supination AROM  - 4-6 x daily - 1 sets - 10-15 reps ?- Tendon Glides  - 3-4 x daily - 5- 10 reps - 2-3 seconds hold ?- Thumb Opposition  - 3-4 x daily - 1 sets - 10 reps ?- Stretch thumb downward   - 2-3 x daily - 3-5 reps - 15 sec hold ?  ?  ?PATIENT EDUCATION: ?Education details: see tx section above for details   ?Person educated: Patient ?Education method: Explanation, Demonstration, Verbal cues, and Handouts ?Education comprehension: verbalized understanding, returned demonstration, verbal cues required, and needs further education ?  ?  ?HOME EXERCISE PROGRAM: ?Access Code: [email or web address] ?URL: [email or web address] ?Prepared by: Benito Mccreedy ?  ?GOALS: ?Goals reviewed with patient? Yes ?  ?  ?SHORT TERM GOALS: (STG required if POC>30 days) ?  ?Pt will obtain protective, custom orthotic. ?Target date: 07/02/21 ?Goal status: MET ?  ?2.  Pt will demo/state understanding of initial HEP to improve pain levels and prerequisite motion. ?Target date: 07/02/21 ?Goal status:  Met 07/14/21 ?  ?  ?LONG TERM GOALS: ?  ?Pt will improve functional ability by decreased impairment per Quick DASH assessment from TBD% to 10% or better, for better quality of life. ?Target date: 09/26/21 ?Goal status: INITIAL ?  ?2.  Pt will improve grip strength in right hand from to at least 60lbs for functional use at home and in IADLs. ?Target date: 09/26/21 ?Goal status: INITIAL ?  ?3.  Pt will improve A/ROM in right wrist flex, ext to at least 40* each, to have functional motion for tasks like reach and grasp.  ?Target date: 09/05/21 ?Goal status: INITIAL ?  ?4.  Pt will improve strength in right wrist from 3-/5 MMT to at least 4+/5 MMT to have increased functional ability to carry out selfcare and higher-level homecare tasks with no difficulty. ?Target date: 09/26/21 ?Goal status: INITIAL ?  ?5.  Pt will decrease pain at worst from 8-9/10 to 2-3/10 or better to have better sleep and occupational participation in daily roles. ?Target date: 08/29/21 ?Goal status: INITIAL ?  ?ASSESSMENT: ?  ?CLINICAL IMPRESSION: ?07/14/21: Pt was doing well at moving fingers in brace, but was a bit scared to have proper hygiene.  He should cope much better now.  ? ? ?  ?PLAN: ?OT FREQUENCY: 2x/week (recommendation once healed and able to start moving wrist and eventually  strengthening, etc. Per MD clearance) ?  ?OT DURATION: 12 weeks ?  ?PLANNED INTERVENTIONS: self care/ADL training, therapeutic exercise, therapeutic activity, neuromuscular re-education, manual therapy,

## 2021-07-14 NOTE — Therapy (Incomplete)
?OUTPATIENT OCCUPATIONAL THERAPY TREATMENT NOTE ? ? ?Patient Name: Lee Fleming ?MRN: [id number] ?DOB:[birth year], 36 y.o., male ?Today's Date: 07/14/2021 ? ?PCP: Patient, [name] (Inactive) ?REFERRING PROVIDER: Sherilyn Cooter, MD ? ?END OF SESSION:  ? ? ?No past medical history on file. ?No past surgical history on file. ?Patient Active Problem List  ? Diagnosis Date Noted  ? Closed fracture of base of fourth metacarpal bone of right hand 06/27/2021  ? Closed fracture of base of third metacarpal bone 06/27/2021  ? ? ?ONSET DATE: 06/22/21 DOI ?  ?REFERRING DIAG:  ?X52.841L (ICD-10-CM) - Closed nondisplaced fracture of base of fourth metacarpal bone of right hand, initial encounter  ?K44.010U (ICD-10-CM) - Closed nondisplaced fracture of base of third metacarpal bone of right hand, initial encounter  ? ? ?THERAPY DIAG:  ?No diagnosis found. ? ? ?PERTINENT HISTORY: Per MD: "Right 3rd, 4th MC base fractures.  Non op.  P1 blocking splint, edema control, etc." ?  ?PRECAUTIONS: 3 week post injury now, NWB in right hand ? ?SUBJECTIVE:  ?*** ? ?PAIN:  ?Are you having pain? *** ?Rating: ***/10 today at rest, up to ***/10 in past week  ? ? ?OBJECTIVE: (All objective assessments below are from initial evaluation on: 07/02/21 unless otherwise specified.)  ? ?HAND DOMINANCE: Right ?  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 for now.  ?  ?  ?FUNCTIONAL OUTCOME MEASURES: ?Quick Dash: TBD ?  ?UE ROM   07/02/21: Rt hand cannot make full fist, fingers stiff, cannot oppose thumb to MF, RF, or SF today.   ?  ?Active ROM Right ?07/02/2021 Right ?07/14/21  ?Elbow flexion ~140   ?Elbow extension ~(-20)   ?Wrist flexion     ?Wrist extension     ?Wrist ulnar deviation     ?Wrist radial deviation     ?Wrist pronation ~80*   ?Wrist supination ~25*   ?(Blank rows = not tested) ?  ?  ?UE MMT:   NT at eval due to fx  ?  ?HAND FUNCTION: ?TBD grip and pinch strength when safe ?   ?COORDINATION: ?Impaired in Rt hand and arm now grossly and with FMS as well due to inability to oppose or make full fist ?  ?SENSATION: ?C/o slight decreased in and around thumb now, but LT intact  ?  ?EDEMA: mild diffuse swelling about hand and wrist now ?  ?COGNITION: ?Overall cognitive status: Within functional limits for tasks assessed ?  ?  ?OBSERVATIONS: Eval: fearful to move elbow, FA, or fingers now, and holding up arm may make shoulder sore/stiff- he is educated against and shown better ways to keep hand elevated.  ?  ?  ?TODAY'S TREATMENT:  ?07/14/21: *** ? ?07/02/21 Eval: Custom orthotic fabrication was indicated due to pt's healing metacarpal fxs and need for safe, functional positioning. OT fabricated custom clamshell wrist and MCP immobilization orthotic for pt today to support right hand/wrist. MCP Js flexed to ~45*, wrist in near neutral, IP Js free. It fit well with no areas of pressure, pt states a comfortable fit. Pt was educated on the wearing schedule, to call or come in ASAP if it is causing any irritation or is not achieving desired function. It will be checked/adjusted in upcoming sessions, as needed. Pt states understanding.  ?  ?He was also edu briefly on home safety, avoiding any lifting or functional use of hand for now. To not remove brace at all (wear bag over in shower).  To do the following exercises as tolerated. He demo's back well, no significant increase in pain today. 2 ?  ?Exercises ?- Standing Elbow Flexion Extension AROM  - 4-6 x daily - 10-15 reps ?- Seated Forearm Pronation and Supination AROM  - 4-6 x daily - 1 sets - 10-15 reps ?- Tendon Glides  - 3-4 x daily - 5- 10 reps - 2-3 seconds hold ?- Thumb Opposition  - 3-4 x daily - 1 sets - 10 reps ?- Stretch thumb downward   - 2-3 x daily - 3-5 reps - 15 sec hold ?  ?  ?PATIENT EDUCATION: ?Education details: see tx section above for details  ?Person educated: Patient ?Education method: Explanation, Demonstration, Verbal cues,  and Handouts ?Education comprehension: verbalized understanding, returned demonstration, verbal cues required, and needs further education ?  ?  ?HOME EXERCISE PROGRAM: ?Access Code: [email or web address] ?URL: [email or web address] ?Prepared by: Benito Mccreedy ?  ?GOALS: ?Goals reviewed with patient? Yes ?  ?  ?SHORT TERM GOALS: (STG required if POC>30 days) ?  ?Pt will obtain protective, custom orthotic. ?Target date: 07/02/21 ?Goal status: MET ?  ?2.  Pt will demo/state understanding of initial HEP to improve pain levels and prerequisite motion. ?Target date: 07/02/21 ?Goal status: INITIAL ?  ?  ?LONG TERM GOALS: ?  ?Pt will improve functional ability by decreased impairment per Quick DASH assessment from TBD% to 10% or better, for better quality of life. ?Target date: 09/26/21 ?Goal status: INITIAL ?  ?2.  Pt will improve grip strength in right hand from to at least 60lbs for functional use at home and in IADLs. ?Target date: 09/26/21 ?Goal status: INITIAL ?  ?3.  Pt will improve A/ROM in right wrist flex, ext to at least 40* each, to have functional motion for tasks like reach and grasp.  ?Target date: 09/05/21 ?Goal status: INITIAL ?  ?4.  Pt will improve strength in right wrist from 3-/5 MMT to at least 4+/5 MMT to have increased functional ability to carry out selfcare and higher-level homecare tasks with no difficulty. ?Target date: 09/26/21 ?Goal status: INITIAL ?  ?5.  Pt will decrease pain at worst from 8-9/10 to 2-3/10 or better to have better sleep and occupational participation in daily roles. ?Target date: 08/29/21 ?Goal status: INITIAL ?  ?ASSESSMENT: ?  ?CLINICAL IMPRESSION: ?07/14/21: *** ? ? ?  ?PLAN: ?OT FREQUENCY: 2x/week (recommendation once healed and able to start moving wrist and eventually strengthening, etc. Per MD clearance) ?  ?OT DURATION: 12 weeks ?  ?PLANNED INTERVENTIONS: self care/ADL training, therapeutic exercise, therapeutic activity, neuromuscular re-education, manual therapy, passive  range of motion, splinting, fluidotherapy, compression bandaging, moist heat, cryotherapy, contrast bath, patient/family education, and coping strategies training ?  ?RECOMMENDED OTHER SERVICES: none other now  ?  ?CONSULTED AND AGREED WITH PLAN OF CARE: Patient ?  ?PLAN FOR NEXT SESSION:  ? ?*** ? ?Check orthotic (pt advised to get scheduled ASAP if having pain/problems), check initial HEP, and once MD determines healing (after 3-4 more weeks), start moving wrist and upgrading exercises.  He may choose to not come in very often due to self-pay, but is encouraged to come up to 2x week if he would like more guidance and therapy.  ?  ? ?Benito Mccreedy, OTR/L, CHT ?07/14/2021, 8:12 AM ? ?  ? ?  ?

## 2021-07-21 ENCOUNTER — Telehealth: Payer: Self-pay | Admitting: Rehabilitative and Restorative Service Providers"

## 2021-07-21 ENCOUNTER — Encounter: Payer: Medicaid Other | Admitting: Rehabilitative and Restorative Service Providers"

## 2021-07-21 NOTE — Therapy (Incomplete)
?OUTPATIENT OCCUPATIONAL THERAPY TREATMENT NOTE ? ? ?Patient Name: Lee Fleming ?MRN: [id number] ?DOB:[birth year], 36 y.o., male ?Today's Date: 07/21/2021 ? ?PCP: Patient, [name] (Inactive) ?REFERRING PROVIDER: Sherilyn Cooter, MD ? ?END OF SESSION:  ? ? ? ?No past medical history on file. ?No past surgical history on file. ?Patient Active Problem List  ? Diagnosis Date Noted  ? Closed fracture of base of fourth metacarpal bone of right hand 06/27/2021  ? Closed fracture of base of third metacarpal bone 06/27/2021  ? ? ?ONSET DATE: 06/22/21 DOI ?  ?REFERRING DIAG:  ?C12.751Z (ICD-10-CM) - Closed nondisplaced fracture of base of fourth metacarpal bone of right hand, initial encounter  ?G01.749S (ICD-10-CM) - Closed nondisplaced fracture of base of third metacarpal bone of right hand, initial encounter  ? ? ?THERAPY DIAG:  ?No diagnosis found. ? ? ?PERTINENT HISTORY: Per MD: "Right 3rd, 4th MC base fractures.  Non op.  P1 blocking splint, edema control, etc." ?  ?PRECAUTIONS: 4 week post injury now, NWB in right hand, Start light wrist AROM at weeks 3-4 as tol, 5-6 weeks start finger AA/ROM and P/ROM as tol, week 6 may start light wrist PROM and consider cutting down orthotic to leave IP Js free, d/c orthosis between 7-8 weeks as tolerated (except for heavy tasks) and start hand strength, 10-12 weeks return to use of hand with activities  ? ?SUBJECTIVE:  ?He states orthotic rubbing in multiple spots, MD visit last week went well. This is his first f/u for 2 weeks in therapy. His arm is dirty, he hasn't taken off brace of sleeve at all, it seems like.  ? ?PAIN:  ?Are you having pain? Yes ?Rating: 5-6/10 today at rest, and no higher than 6/10 in past 2 weeks ? ? ?OBJECTIVE: (All objective assessments below are from initial evaluation on: 07/02/21 unless otherwise specified.)  ? ?HAND DOMINANCE: Right ?  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 for now.  ?  ?   ?FUNCTIONAL OUTCOME MEASURES: ?Quick Dash: TBD ?  ?UE ROM   07/02/21: Rt hand cannot make full fist, fingers stiff, cannot oppose thumb to MF, RF, or SF today.   ? ?07/21/21: *** ?  ?Active ROM Right ?07/02/2021 Right ?07/21/21  ?Elbow flexion ~140   ?Elbow extension ~(-20)   ?Wrist flexion   ***  ?Wrist extension   ***  ?Wrist ulnar deviation     ?Wrist radial deviation     ?Wrist pronation ~80*   ?Wrist supination ~25*   ?(Blank rows = not tested) ?  ?  ?UE MMT:   NT at eval due to fx  ?  ?HAND FUNCTION: ?TBD grip and pinch strength when safe ?  ?COORDINATION: ?Impaired in Rt hand and arm now grossly and with FMS as well due to inability to oppose or make full fist ?  ?SENSATION: ?C/o slight decreased in and around thumb now, but LT intact  ?  ?EDEMA: mild diffuse swelling about hand and wrist now ?  ?COGNITION: ?Overall cognitive status: Within functional limits for tasks assessed ?  ?  ?OBSERVATIONS:  ?07/21/21: *** ? ?07/14/21: arm and brace are both dirty and smell rank.  ? ?Eval: fearful to move elbow, FA, or fingers now, and holding up arm may make shoulder sore/stiff- he is educated against and shown better ways to keep hand elevated.  ? ?  ?  ?TODAY'S TREATMENT:  ?07/21/21: *** ? ?07/14/21: OT adjusts orthotic around thumb, forearm  and fingers where it was apply some pressure. OT also has to reattach all self-adhesive velcro today and supply all new straps.  He is edu for self-care to now wash arm at least 1xday and is given new stockinette, edu to wash this and switch to keep clean. He states understanding and is able to wash his arm without much pain, with OT guidance.  He also reviews basic HEP and performs AROM at sh, elbow, and gently in forearm.  Due to healing seen on x-ray, OT also has him start very light AROM at fingers and wrist flex/ext with orthotic off, and this was able to be done with no pain today. He is cautioned to "back off" or stop if this becomes painful at all. He states understanding and  leaves with no significant pain.  ? ?07/02/21 Eval: Custom orthotic fabrication was indicated due to pt's healing metacarpal fxs and need for safe, functional positioning. OT fabricated custom clamshell wrist and MCP immobilization orthotic for pt today to support right hand/wrist. MCP Js flexed to ~45*, wrist in near neutral, IP Js free. It fit well with no areas of pressure, pt states a comfortable fit. Pt was educated on the wearing schedule, to call or come in ASAP if it is causing any irritation or is not achieving desired function. It will be checked/adjusted in upcoming sessions, as needed. Pt states understanding.  ?  ?He was also edu briefly on home safety, avoiding any lifting or functional use of hand for now. To not remove brace at all (wear bag over in shower). To do the following exercises as tolerated. He demo's back well, no significant increase in pain today. 2 ?  ?Exercises ?- Standing Elbow Flexion Extension AROM  - 4-6 x daily - 10-15 reps ?- Seated Forearm Pronation and Supination AROM  - 4-6 x daily - 1 sets - 10-15 reps ?- Tendon Glides  - 3-4 x daily - 5- 10 reps - 2-3 seconds hold ?- Thumb Opposition  - 3-4 x daily - 1 sets - 10 reps ?- Stretch thumb downward   - 2-3 x daily - 3-5 reps - 15 sec hold ?  ?  ?PATIENT EDUCATION: ?Education details: see tx section above for details  ?Person educated: Patient ?Education method: Explanation, Demonstration, Verbal cues, and Handouts ?Education comprehension: verbalized understanding, returned demonstration, verbal cues required, and needs further education ?  ?  ?HOME EXERCISE PROGRAM: ?Access Code: [email or web address] ?URL: [email or web address] ?Prepared by: Benito Mccreedy ?  ?GOALS: ?Goals reviewed with patient? Yes ?  ?  ?SHORT TERM GOALS: (STG required if POC>30 days) ?  ?Pt will obtain protective, custom orthotic. ?Target date: 07/02/21 ?Goal status: MET ?  ?2.  Pt will demo/state understanding of initial HEP to improve pain levels and  prerequisite motion. ?Target date: 07/02/21 ?Goal status: Met 07/14/21 ?  ?  ?LONG TERM GOALS: ?  ?Pt will improve functional ability by decreased impairment per Quick DASH assessment from TBD% to 10% or better, for better quality of life. ?Target date: 09/26/21 ?Goal status: INITIAL ?  ?2.  Pt will improve grip strength in right hand from to at least 60lbs for functional use at home and in IADLs. ?Target date: 09/26/21 ?Goal status: INITIAL ?  ?3.  Pt will improve A/ROM in right wrist flex, ext to at least 40* each, to have functional motion for tasks like reach and grasp.  ?Target date: 09/05/21 ?Goal status: INITIAL ?  ?4.  Pt will improve strength in right wrist  from 3-/5 MMT to at least 4+/5 MMT to have increased functional ability to carry out selfcare and higher-level homecare tasks with no difficulty. ?Target date: 09/26/21 ?Goal status: INITIAL ?  ?5.  Pt will decrease pain at worst from 8-9/10 to 2-3/10 or better to have better sleep and occupational participation in daily roles. ?Target date: 08/29/21 ?Goal status: INITIAL ?  ?ASSESSMENT: ?  ?CLINICAL IMPRESSION: ?07/21/21: *** ? ?07/14/21: Pt was doing well at moving fingers in brace, but was a bit scared to have proper hygiene.  He should cope much better now.  ? ? ?  ?PLAN: ?OT FREQUENCY: 2x/week (recommendation once healed and able to start moving wrist and eventually strengthening, etc. Per MD clearance) ?  ?OT DURATION: 12 weeks ?  ?PLANNED INTERVENTIONS: self care/ADL training, therapeutic exercise, therapeutic activity, neuromuscular re-education, manual therapy, passive range of motion, splinting, fluidotherapy, compression bandaging, moist heat, cryotherapy, contrast bath, patient/family education, and coping strategies training ?  ?RECOMMENDED OTHER SERVICES: none other now  ?  ?CONSULTED AND AGREED WITH PLAN OF CARE: Patient ?  ?PLAN FOR NEXT SESSION:  ? *** ? ?Check orthotic, try to progress AROM at wrist and digits, add FMS as tolerated, take new  motion measures.  ?  ? ?Benito Mccreedy, OTR/L, CHT ?07/21/2021, 8:17 AM ? ?  ? ?  ?

## 2021-07-21 NOTE — Telephone Encounter (Signed)
OT called patient to discuss missed (NS) appointment today. He "slept in."  OT spoke with pt about next appointment date/time Wed at 2:30 pm. They were reminded of the attendance policy and future missed appointments could lead to early discharge from therapy. ? ?

## 2021-07-23 ENCOUNTER — Encounter: Payer: Self-pay | Admitting: Rehabilitative and Restorative Service Providers"

## 2021-07-23 ENCOUNTER — Ambulatory Visit (INDEPENDENT_AMBULATORY_CARE_PROVIDER_SITE_OTHER): Payer: Self-pay | Admitting: Rehabilitative and Restorative Service Providers"

## 2021-07-23 DIAGNOSIS — M25641 Stiffness of right hand, not elsewhere classified: Secondary | ICD-10-CM

## 2021-07-23 DIAGNOSIS — R600 Localized edema: Secondary | ICD-10-CM

## 2021-07-23 DIAGNOSIS — M25631 Stiffness of right wrist, not elsewhere classified: Secondary | ICD-10-CM

## 2021-07-23 DIAGNOSIS — R278 Other lack of coordination: Secondary | ICD-10-CM

## 2021-07-23 DIAGNOSIS — M6281 Muscle weakness (generalized): Secondary | ICD-10-CM

## 2021-07-23 DIAGNOSIS — M79641 Pain in right hand: Secondary | ICD-10-CM

## 2021-07-23 DIAGNOSIS — M25531 Pain in right wrist: Secondary | ICD-10-CM

## 2021-07-23 NOTE — Therapy (Signed)
?OUTPATIENT OCCUPATIONAL THERAPY TREATMENT NOTE ? ? ?Patient Name: Lee Fleming ?MRN: [id number] ?DOB:[birth year], 36 y.o., male ?Today's Date: 07/23/2021 ? ?PCP: Patient, [name] (Inactive) ?REFERRING PROVIDER: Marlyne Beards, MD ? ?END OF SESSION:  ? OT End of Session - 07/23/21 1425   ? ? Visit Number 3   ? Number of Visits 12   ? Date for OT Re-Evaluation 09/26/21   ? Authorization Type self-pay   ? OT Start Time 1430   ? OT Stop Time 1506   ? OT Time Calculation (min) 36 min   ? Activity Tolerance Patient tolerated treatment well;No increased pain;Patient limited by pain   ? Behavior During Therapy [facility] for tasks assessed/performed   ? ?  ?  ? ?  ? ? ? ?History reviewed. No pertinent past medical history. ?History reviewed. No pertinent surgical history. ?Patient Active Problem List  ? Diagnosis Date Noted  ? Closed fracture of base of fourth metacarpal bone of right hand 06/27/2021  ? Closed fracture of base of third metacarpal bone 06/27/2021  ? ? ?ONSET DATE: 06/22/21 DOI ?  ?REFERRING DIAG:  ?O67.124P (ICD-10-CM) - Closed nondisplaced fracture of base of fourth metacarpal bone of right hand, initial encounter  ?Y09.983J (ICD-10-CM) - Closed nondisplaced fracture of base of third metacarpal bone of right hand, initial encounter  ? ? ?THERAPY DIAG:  ?Localized edema ? ?Stiffness of right hand, not elsewhere classified ? ?Other lack of coordination ? ?Muscle weakness (generalized) ? ?Pain in right wrist ? ?Stiffness of right wrist, not elsewhere classified ? ?Pain in right hand ? ? ?PERTINENT HISTORY: Per MD: "Right 3rd, 4th MC base fractures.  Non op.  P1 blocking splint, edema control, etc." ?  ?PRECAUTIONS: 4+ week post injury now, NWB in right hand, Start light wrist AROM at weeks 3-4 as tol, 5-6 weeks start finger AA/ROM and P/ROM as tol, week 6 may start light wrist PROM and consider cutting down orthotic to leave IP Js free, d/c orthosis between 7-8 weeks as tolerated (except for heavy tasks) and  start hand strength, 10-12 weeks return to use of hand with activities  ? ?SUBJECTIVE:  ?He states taking off orthotic to move hand and let skin "breathe." He hasn't caused pain and he has been "taking it easy." He states needing to leave before end of session due to his ride being ready to leave.  ? ? ?PAIN:  ?Are you having pain? No  ?Rating: 0/10 today at rest ? ?OBJECTIVE: (All objective assessments below are from initial evaluation on: 07/02/21 unless otherwise specified.)  ? ?HAND DOMINANCE: Right ?  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 for now.  ?  ?  ?FUNCTIONAL OUTCOME MEASURES: ?07/23/21: PSFS: 3.3 (cooking, dressing, showering/washing) ?  ?UE ROM   07/02/21: Rt hand cannot make full fist, fingers stiff, cannot oppose thumb to MF, RF, or SF today.   ? ?07/23/21: Almost full fist now rt hand, tracking MF MCP: 0-62*, PIP: 0-98*, DIP: 0-44*, can oppose to pad SF with some effort today  ?  ?Active ROM Right ?07/02/2021 Right ?07/23/21  ?Elbow flexion ~140 ~155* WNL now  ?Elbow extension ~(-20) (-10*) equal to Lt side  ?Wrist flexion   27  ?Wrist extension   41  ?Wrist ulnar deviation   16  ?Wrist radial deviation   13  ?Wrist pronation ~80* 85  ?Wrist supination ~25* 78  ?(Blank rows = not tested) ?  ?  ?UE  MMT:   NT at eval due to fx  ?  ?HAND FUNCTION: ?TBD grip and pinch strength when safe ?  ?COORDINATION: ?Impaired in Rt hand and arm now grossly and with FMS as well due to inability to oppose or make full fist ?  ?SENSATION: ?C/o slight decreased in and around thumb now, but LT intact  ?  ?EDEMA: mild diffuse swelling about hand and wrist now ?  ?COGNITION: ?Overall cognitive status: Within functional limits for tasks assessed ?  ?  ?OBSERVATIONS:  ?07/23/21: He seems to be doing very well, fingers are a lot looser as well as FA. He did start AROM at wrist before being advised to in therapy, but no ill effects right now.  ? ?07/14/21: arm and brace are both  dirty and smell rank.  ? ?Eval: fearful to move elbow, FA, or fingers now, and holding up arm may make shoulder sore/stiff- he is educated against and shown better ways to keep hand elevated.  ? ?  ?  ?TODAY'S TREATMENT:  ?07/23/21: He does AROM at elbow - hand for new measures and reviews all current HEP. He begins wrist motion in 3 planes and tolerates well with no significant pain in limited ROM. OT also provides a detailed HEP handout for the next 2-3 weeks that includes progression to wrist stretches, once MD f/u happens on 08/08/21 (and he declares fx are healing).  He demo's back all well (except finger and wrist stretches which were demo'd to him, but he must wait to perform at home). OT again reviews self-care and non-weight bearing in Rt hand until MD declares healing.  ? ?Exercises ?- Standing Shoulder Flexion Full Range  - 3-4 x daily - 1-2 sets - 10-15 reps ?- Standing Elbow Flexion Extension AROM  - 4-6 x daily - 10-15 reps ?- Seated Forearm Pronation and Supination AROM  - 4-6 x daily - 1 sets - 10-15 reps ?- Bend and Pull Back Wrist SLOWLY  - 3-4 x daily - 1-2 sets - 10-15 reps ?- Wrist AROM Dart Throwers Motion  - 3-4 x daily - 1-2 sets - 10-15 reps ?- "Windshield Wipers"   - 3-4 x daily - 1-2 sets - 10-15 reps ?- Tendon Glides  - 3-4 x daily - 5- 10 reps - 2-3 seconds hold ?- Thumb Opposition  - 3-4 x daily - 1 sets - 10 reps ?Wait 1 week, then:  ?- Seated Finger Composite Flexion Stretch  - 4-6 x daily - 3-5 reps - 15 hold ?Wait 2 weeks (after MD f/u) and then: ?- Seated Wrist Flexion with Overpressure  - 3-4 x daily - 3-5 reps - 15 sec hold ?- Wrist Extension Stretch Pronated  - 3-4 x daily - 3-5 reps - 15 hold ? ? ?07/14/21: OT adjusts orthotic around thumb, forearm and fingers where it was apply some pressure. OT also has to reattach all self-adhesive velcro today and supply all new straps.  He is edu for self-care to now wash arm at least 1xday and is given new stockinette, edu to wash this and  switch to keep clean. He states understanding and is able to wash his arm without much pain, with OT guidance.  He also reviews basic HEP and performs AROM at sh, elbow, and gently in forearm.  Due to healing seen on x-ray, OT also has him start very light AROM at fingers and wrist flex/ext with orthotic off, and this was able to be done with no pain today.  He is cautioned to "back off" or stop if this becomes painful at all. He states understanding and leaves with no significant pain.  ? ?07/02/21 Eval: Custom orthotic fabrication was indicated due to pt's healing metacarpal fxs and need for safe, functional positioning. OT fabricated custom clamshell wrist and MCP immobilization orthotic for pt today to support right hand/wrist. MCP Js flexed to ~45*, wrist in near neutral, IP Js free. It fit well with no areas of pressure, pt states a comfortable fit. Pt was educated on the wearing schedule, to call or come in ASAP if it is causing any irritation or is not achieving desired function. It will be checked/adjusted in upcoming sessions, as needed. Pt states understanding.  ?  ?He was also edu briefly on home safety, avoiding any lifting or functional use of hand for now. To not remove brace at all (wear bag over in shower). To do the following exercises as tolerated. He demo's back well, no significant increase in pain today. 2 ?  ?Exercises ?- Standing Elbow Flexion Extension AROM  - 4-6 x daily - 10-15 reps ?- Seated Forearm Pronation and Supination AROM  - 4-6 x daily - 1 sets - 10-15 reps ?- Tendon Glides  - 3-4 x daily - 5- 10 reps - 2-3 seconds hold ?- Thumb Opposition  - 3-4 x daily - 1 sets - 10 reps ?- Stretch thumb downward   - 2-3 x daily - 3-5 reps - 15 sec hold ?  ?  ?PATIENT EDUCATION: ?Education details: see tx section above for details  ?Person educated: Patient ?Education method: Explanation, Demonstration, Verbal cues, and Handouts ?Education comprehension: verbalized understanding, returned  demonstration, verbal cues required, and needs further education ?  ?  ?HOME EXERCISE PROGRAM: ?Access Code: [email or web address] ?URL: [email or web address] ?Prepared by: Fannie KneeNathanael Meerab Maselli ?  ?GOALS: ?Goals reviewe

## 2021-07-24 ENCOUNTER — Encounter: Payer: Medicaid Other | Admitting: Rehabilitative and Restorative Service Providers"

## 2021-08-08 ENCOUNTER — Ambulatory Visit (INDEPENDENT_AMBULATORY_CARE_PROVIDER_SITE_OTHER): Payer: Self-pay

## 2021-08-08 ENCOUNTER — Encounter: Payer: Self-pay | Admitting: Orthopedic Surgery

## 2021-08-08 ENCOUNTER — Ambulatory Visit (INDEPENDENT_AMBULATORY_CARE_PROVIDER_SITE_OTHER): Payer: Self-pay | Admitting: Orthopedic Surgery

## 2021-08-08 DIAGNOSIS — S62344A Nondisplaced fracture of base of fourth metacarpal bone, right hand, initial encounter for closed fracture: Secondary | ICD-10-CM

## 2021-08-08 DIAGNOSIS — S62342A Nondisplaced fracture of base of third metacarpal bone, right hand, initial encounter for closed fracture: Secondary | ICD-10-CM
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
## 2021-08-18 ENCOUNTER — Encounter: Payer: Self-pay | Admitting: Rehabilitative and Restorative Service Providers"

## 2021-08-18 ENCOUNTER — Ambulatory Visit (INDEPENDENT_AMBULATORY_CARE_PROVIDER_SITE_OTHER): Payer: Self-pay | Admitting: Rehabilitative and Restorative Service Providers"

## 2021-08-18 DIAGNOSIS — R600 Localized edema: Secondary | ICD-10-CM

## 2021-08-18 DIAGNOSIS — M6281 Muscle weakness (generalized): Secondary | ICD-10-CM

## 2021-08-18 DIAGNOSIS — M79641 Pain in right hand: Secondary | ICD-10-CM

## 2021-08-18 DIAGNOSIS — M25631 Stiffness of right wrist, not elsewhere classified: Secondary | ICD-10-CM

## 2021-08-18 DIAGNOSIS — M25531 Pain in right wrist: Secondary | ICD-10-CM

## 2021-08-18 DIAGNOSIS — M25641 Stiffness of right hand, not elsewhere classified: Secondary | ICD-10-CM

## 2021-08-18 DIAGNOSIS — R278 Other lack of coordination: Secondary | ICD-10-CM

## 2021-08-18 NOTE — Therapy (Addendum)
OUTPATIENT OCCUPATIONAL THERAPY TREATMENT & DISCHARGE NOTE   Patient Name: Lee Fleming MRN: [id number] DOB:[birth year], 36 y.o., male Today's Date: 08/18/2021  PCP: Patient, [name] (Inactive) REFERRING PROVIDER: Sherilyn Cooter, MD  END OF SESSION:   OT End of Session - 08/18/21 1113     Visit Number 4    Number of Visits 12    Date for OT Re-Evaluation 09/26/21    Authorization Type self-pay    OT Start Time 1113    OT Stop Time 1151    OT Time Calculation (min) 38 min    Activity Tolerance Patient tolerated treatment well;No increased pain;Patient limited by pain    Behavior During Therapy [facility] for tasks assessed/performed               History reviewed. No pertinent past medical history. History reviewed. No pertinent surgical history. Patient Active Problem List   Diagnosis Date Noted   Closed fracture of base of fourth metacarpal bone of right hand 06/27/2021   Closed fracture of base of third metacarpal bone 06/27/2021    ONSET DATE: 06/22/21 DOI   REFERRING DIAG:  Q00.867Y (ICD-10-CM) - Closed nondisplaced fracture of base of fourth metacarpal bone of right hand, initial encounter  S62.342A (ICD-10-CM) - Closed nondisplaced fracture of base of third metacarpal bone of right hand, initial encounter    THERAPY DIAG:  Localized edema  Stiffness of right hand, not elsewhere classified  Other lack of coordination  Stiffness of right wrist, not elsewhere classified  Pain in right wrist  Muscle weakness (generalized)  Pain in right hand   PERTINENT HISTORY: Per MD: "Right 3rd, 4th MC base fractures.  Non op.  P1 blocking splint, edema control, etc."   PRECAUTIONS: 8 weeks post injury now, NWB in right hand, Start light wrist AROM at weeks 3-4 as tol, 5-6 weeks start finger AA/ROM and P/ROM as tol, week 6 may start light wrist PROM and consider cutting down orthotic to leave IP Js free, d/c orthosis between 7-8 weeks as tolerated (except for heavy  tasks) and start hand strength, 10-12 weeks return to use of hand with activities   SUBJECTIVE:  He arrives a bit late, states no pain in past few weeks, hasn't been lifting heavy, states no significant problems.    PAIN:  Are you having pain? 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 for now.      FUNCTIONAL OUTCOME MEASURES: 07/23/21: PSFS: 3.3 (cooking, dressing, showering/washing)   UE ROM   07/02/21: Rt hand cannot make full fist, fingers stiff, cannot oppose thumb to MF, RF, or SF today.    07/23/21: Almost full fist now rt hand, tracking MF MCP: 0-62*, PIP: 0-98*, DIP: 0-44*, can oppose to pad SF with some effort today    Active ROM Right 07/02/2021 Right 07/23/21 Right 08/18/21  Elbow flexion ~140 ~155* WNL now Full  Elbow extension ~(-20) (-10*) equal to Lt side Full  Wrist flexion   27 63  Wrist extension   41 54  Wrist ulnar deviation   16 47  Wrist radial deviation   13 22  Wrist pronation ~80* 85 85  Wrist supination ~25* 78 85  (Blank rows = not tested)     UE MMT:   NT at eval due  to fx    HAND FUNCTION: 08/18/21: Right grip: 39# (left 113#)    COORDINATION: 08/18/21: right 9 Hole Peg Test: 20 (17.8sec is avg, 15-26sec is [facility])   SENSATION: C/o slight decreased in and around thumb now, but LT intact    EDEMA: mild diffuse swelling about hand and wrist now   COGNITION: Overall cognitive status: Within functional limits for tasks assessed     OBSERVATIONS:  08/18/21: He has full fist, full opposition, wrist moving much better, can now tolerate strength  07/23/21: He seems to be doing very well, fingers are a lot looser as well as FA. He did start AROM at wrist before being advised to in therapy, but no ill effects right now.       TODAY'S TREATMENT:  08/18/21: He does AROM for exercise and new measures- doing very well now! OT also reviews full HEP, adds strength at hand in wrist in multiple planes as below.  He tolerates all well, no pain. Advised to keep on this progressively for next 3-5 weeks before doing heavy activities, but he is cleared for light, non-painful tasks now. He states understanding.   Exercises - Seated Wrist Flexion with Overpressure  - 3-4 x daily - 3-5 reps - 15 sec hold - Wrist Extension Stretch Pronated  - 3-4 x daily - 3-5 reps - 15 hold - Bend and Pull Back Wrist SLOWLY  - 3-4 x daily - 1-2 sets - 10-15 reps - Full Fist  - 2-3 x daily - 5 reps - Seated Claw Fist with Putty  - 2-3 x daily - 5 reps - "Duck Mouth" Strength  - 2-3 x daily - 5 reps - Finger Extension "Pizza!"   - 2-3 x daily - 5 reps - Thumb Press  - 2-3 x daily - 5 reps - Thumb Opposition with Putty  - 2-3 x daily - 5 reps - Wrist AROM Dart Throwers Motion with Resistance  - 2-3 x daily - 5-10 reps    PATIENT EDUCATION: Education details: see tx section above for details  Person educated: Patient Education method: Consulting civil engineer, Demonstration, Verbal cues, and Handouts Education comprehension: verbalized understanding, returned demonstration, verbal cues required, and needs further education     HOME EXERCISE PROGRAM: Access Code: [email or web address] URL: [email or web address] Prepared by: Benito Mccreedy   GOALS: Goals reviewed with patient? Yes     SHORT TERM GOALS: (STG required if POC>30 days)   Pt will obtain protective, custom orthotic. Target date: 07/02/21 Goal status: MET   2.  Pt will demo/state understanding of initial HEP to improve pain levels and prerequisite motion. Target date: 07/02/21 Goal status: Met 07/14/21     LONG TERM GOALS:   Pt will improve functional ability by decreased impairment per Quick DASH assessment from TBD% to 10% or better, for better quality of life. Target date:  09/26/21 Goal status: INITIAL   2.  Pt will improve grip strength in right hand from to at least 60lbs for functional use at home and in IADLs. Target date: 09/26/21 Goal status: INITIAL   3.  Pt will improve A/ROM in right wrist flex, ext to at least 40* each, to have functional motion for tasks like reach and grasp.  Target date: 09/05/21 Goal status: INITIAL   4.  Pt will improve strength in right wrist from 3-/5 MMT to at least 4+/5 MMT to have increased functional ability to carry out selfcare and higher-level homecare tasks with no difficulty. Target date: 09/26/21 Goal status: INITIAL  5.  Pt will decrease pain at worst from 8-9/10 to 2-3/10 or better to have better sleep and occupational participation in daily roles. Target date: 08/29/21 Goal status: INITIAL   ASSESSMENT:   CLINICAL IMPRESSION: 08/18/21: He has excellent motion now, though wrist still has some tightness and weakness which is being addressed with HEP stretches and strength. He was advised to make f/u appt in next 2-3 weeks as needed, but if he's doing well (doesn't feel pain or weakness) he may not have to return to therapy.     PLAN: OT FREQUENCY: d/c   OT DURATION: d/c   PLANNED INTERVENTIONS: self care/ADL training, therapeutic exercise, therapeutic activity, neuromuscular re-education, manual therapy, passive range of motion, splinting, fluidotherapy, compression bandaging, moist heat, cryotherapy, contrast bath, patient/family education, and coping strategies training   RECOMMENDED OTHER SERVICES: none other now    CONSULTED AND AGREED WITH PLAN OF CARE: Patient   PLAN FOR NEXT SESSION:   If he feels need to f/u for advanced PRE or due to pain/problems, address issues at that time. If he doesn't return before 09/05/21, he will be fit for discharge, as was explained to him today.    Benito Mccreedy, OTR/L, CHT 08/18/2021, 1:12 PM   OCCUPATIONAL THERAPY DISCHARGE SUMMARY  Visits from Start of Care:  4  Current functional level related to goals / functional outcomes: Pt did not return to therapy and did not contact us for months. He will be discharged officially at this point.   Remaining deficits: Please see notes   Education / Equipment: Please see notes   Benito Mccreedy, OTR/L, CHT 10/20/21

## 2021-09-05 ENCOUNTER — Ambulatory Visit (INDEPENDENT_AMBULATORY_CARE_PROVIDER_SITE_OTHER): Payer: Self-pay

## 2021-09-05 ENCOUNTER — Ambulatory Visit (INDEPENDENT_AMBULATORY_CARE_PROVIDER_SITE_OTHER): Payer: Self-pay | Admitting: Orthopedic Surgery

## 2021-09-05 DIAGNOSIS — S62342A Nondisplaced fracture of base of third metacarpal bone, right hand, initial encounter for closed fracture: Secondary | ICD-10-CM

## 2021-09-05 DIAGNOSIS — S62344A Nondisplaced fracture of base of fourth metacarpal bone, right hand, initial encounter for closed fracture: Secondary | ICD-10-CM
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
## 2021-09-15 ENCOUNTER — Telehealth: Payer: Self-pay | Admitting: Rehabilitative and Restorative Service Providers"

## 2021-09-15 ENCOUNTER — Encounter: Payer: Medicaid Other | Admitting: Rehabilitative and Restorative Service Providers"

## 2021-09-15 NOTE — Telephone Encounter (Signed)
OT called patient to discuss missed appointment. OT spoke with pt, who had slept in, and got him rescheduled for tomorrow at 1145am. He states still needing hand therapy and he was reminded of the attendance policy and future missed appointments could lead to early discharge from therapy.

## 2021-09-16 ENCOUNTER — Telehealth: Payer: Self-pay | Admitting: Rehabilitative and Restorative Service Providers"

## 2021-09-16 ENCOUNTER — Encounter: Payer: Medicaid Other | Admitting: Rehabilitative and Restorative Service Providers"

## 2021-09-16 NOTE — Telephone Encounter (Signed)
He was called due to missing 2nd appt in a row.  OT asks him to call and reschedule if he needs to return to therapy and if he misses again he will be discharged.  If he doesn't call in/reschedule for 2-3 weeks, he will also be discharged.

## 2021-10-13 ENCOUNTER — Encounter (HOSPITAL_COMMUNITY): Payer: Self-pay | Admitting: Emergency Medicine

## 2021-10-13 ENCOUNTER — Emergency Department (HOSPITAL_COMMUNITY)
Admission: EM | Admit: 2021-10-13 | Discharge: 2021-10-13 | Disposition: A | Discharge status: Home / Self Care | Payer: Medicaid Other | Attending: Emergency Medicine | Admitting: Emergency Medicine

## 2021-10-13 DIAGNOSIS — Z202 Contact with and (suspected) exposure to infections with a predominantly sexual mode of transmission: Secondary | ICD-10-CM | POA: Insufficient documentation

## 2021-10-13 MED ORDER — METRONIDAZOLE 500 MG PO TABS
2000.0000 mg | ORAL_TABLET | Freq: Once | ORAL | Status: AC
  Administered 2021-10-13: 2000 mg via ORAL
  Filled 2021-10-13: qty 4

## 2021-10-13 NOTE — ED Provider Notes (Signed)
[facility]-EMERGENCY DEPT Provider Note   CSN: [id number] Arrival date & time: 10/13/21  1419     History  Chief Complaint  Patient presents with   Exposure to STD    Lee Fleming is a 36 y.o. male.  Patient presents to the emergency department today requesting treatment for trichomonas.  He states that his partner developed a vaginal discharge and she tested positive for trichomonas.  Patient has been asymptomatic except for having a mild tingling sensation after urination yesterday.  No drainage or discharge.  No sores or lesions.  No treatments prior to arrival.  States that he came right to the emergency department after he found out.       Home Medications Prior to Admission medications   Medication Sig Start Date End Date Taking? Authorizing Provider  dicyclomine (BENTYL) 20 MG tablet Take 1 tablet (20 mg total) by mouth 2 (two) times daily. 06/06/21   Darrick Grinder, PA-C  methocarbamol (ROBAXIN) 500 MG tablet Take 1 tablet (500 mg total) by mouth 2 (two) times daily. 04/28/21   Prosperi, Christian H, PA-C      Allergies    Iodine    Review of Systems   Review of Systems  Physical Exam Updated Vital Signs BP (!) 118/96 (BP Location: Right Arm)   Pulse 78   Temp 98.4 F (36.9 C) (Oral)   Resp 16   Ht 5\' 9"  (1.753 m)   Wt 58.9 kg   SpO2 100%   BMI 19.18 kg/m   Physical Exam Vitals and nursing note reviewed.  Constitutional:      Appearance: He is well-developed.  HENT:     Head: Normocephalic and atraumatic.  Eyes:     Conjunctiva/sclera: Conjunctivae normal.  Pulmonary:     Effort: No respiratory distress.  Genitourinary:    Comments: Patient declines GU exam Musculoskeletal:     Cervical back: Normal range of motion and neck supple.  Skin:    General: Skin is warm and dry.  Neurological:     Mental Status: He is alert.     ED Results / Procedures / Treatments   Labs (all labs ordered are listed, but only abnormal  results are displayed) Labs Reviewed  GC/CHLAMYDIA PROBE AMP ([place]) NOT AT [facility]    EKG None  Radiology No results found.  Procedures Procedures    Medications Ordered in ED Medications  metroNIDAZOLE (FLAGYL) tablet 2,000 mg (2,000 mg Oral Given 10/13/21 1643)    ED Course/ Medical Decision Making/ A&P    Patient seen and examined. History obtained directly from patient.   Labs/EKG: Ordered testing for GC/chlamydia  Imaging: None ordered.   Medications/Fluids: Metronidazole 2g PO  Most recent vital signs reviewed and are as follows: BP (!) 118/96 (BP Location: Right Arm)   Pulse 78   Temp 98.4 F (36.9 C) (Oral)   Resp 16   Ht 5\' 9"  (1.753 m)   Wt 58.9 kg   SpO2 100%   BMI 19.18 kg/m   Initial impression: Exposure to STD  Home treatment plan: Will treat for STD exposure. Patient counseled on safe sexual practices, encouraged them to avoid sexual contact for 7 days and to inform sexual partners so that they can get tested and treated as well.   Return instructions discussed with patient: Patient urged to return with worsening symptoms or other concerns. Patient verbalized understanding and agrees with plan.   Follow-up instructions discussed with patient: Follow-up with health department as  needed.                           Medical Decision Making Risk Prescription drug management.   Patient with trichomonas exposure, no significant symptoms. GC/chlamydia considered. Testing ordered. Denies sores to suggest HSV.         Final Clinical Impression(s) / ED Diagnoses Final diagnoses:  STD exposure    Rx / DC Orders ED Discharge Orders     None         Renne Crigler, PA-C 10/13/21 1650    Rondel Baton, MD 10/15/21 1355

## 2021-10-13 NOTE — Discharge Instructions (Addendum)
Please read and follow all provided instructions.  Your diagnoses today include:  1. STD exposure     Tests performed today include: Test for gonorrhea and chlamydia if urine obtained Vital signs. See below for your results today.   Medications:  You were also given metronidazole (pills) that treat for trichomonas.   Home care instructions:  Read educational materials contained in this packet and follow any instructions provided.   You should tell your partners about your infection and avoid having sex for one week to allow time for the medicine to work.  Sexually transmitted disease testing also available at:  [facility] [place], [facility] [address] [place], phone [phone] or [phone]   Monday - Friday, call for an appointment  Return instructions:  Please return to the Emergency Department if you experience worsening symptoms.  Please return if you have any other emergent concerns.  Additional Information:  Your vital signs today were: BP (!) 118/96 (BP Location: Right Arm)   Pulse 78   Temp 98.4 F (36.9 C) (Oral)   Resp 16   Ht 5\' 9"  (1.753 m)   Wt 58.9 kg   SpO2 100%   BMI 19.18 kg/m  If your blood pressure (BP) was elevated above 135/85 this visit, please have this repeated by your doctor within one month. --------------

## 2021-10-13 NOTE — ED Triage Notes (Signed)
Pt here for STD medication, partner positive for trich. Pt denies any symptoms.

## 2022-01-27 ENCOUNTER — Telehealth: Payer: Self-pay | Admitting: Family Medicine

## 2022-01-27 DIAGNOSIS — Z202 Contact with and (suspected) exposure to infections with a predominantly sexual mode of transmission: Secondary | ICD-10-CM

## 2022-01-27 NOTE — Progress Notes (Signed)
Lee Fleming   Needs to get STI exposure testing in person.   Patient acknowledged agreement and understanding of the plan.

## 2023-02-14 IMAGING — CR DG WRIST COMPLETE 3+V*R*
4 series · 4 of 4 positions shown · non-contrast
Comparison: None.

CLINICAL DATA: Injury

EXAM:
RIGHT WRIST - COMPLETE 3+ VIEW

[x wrist lat right]
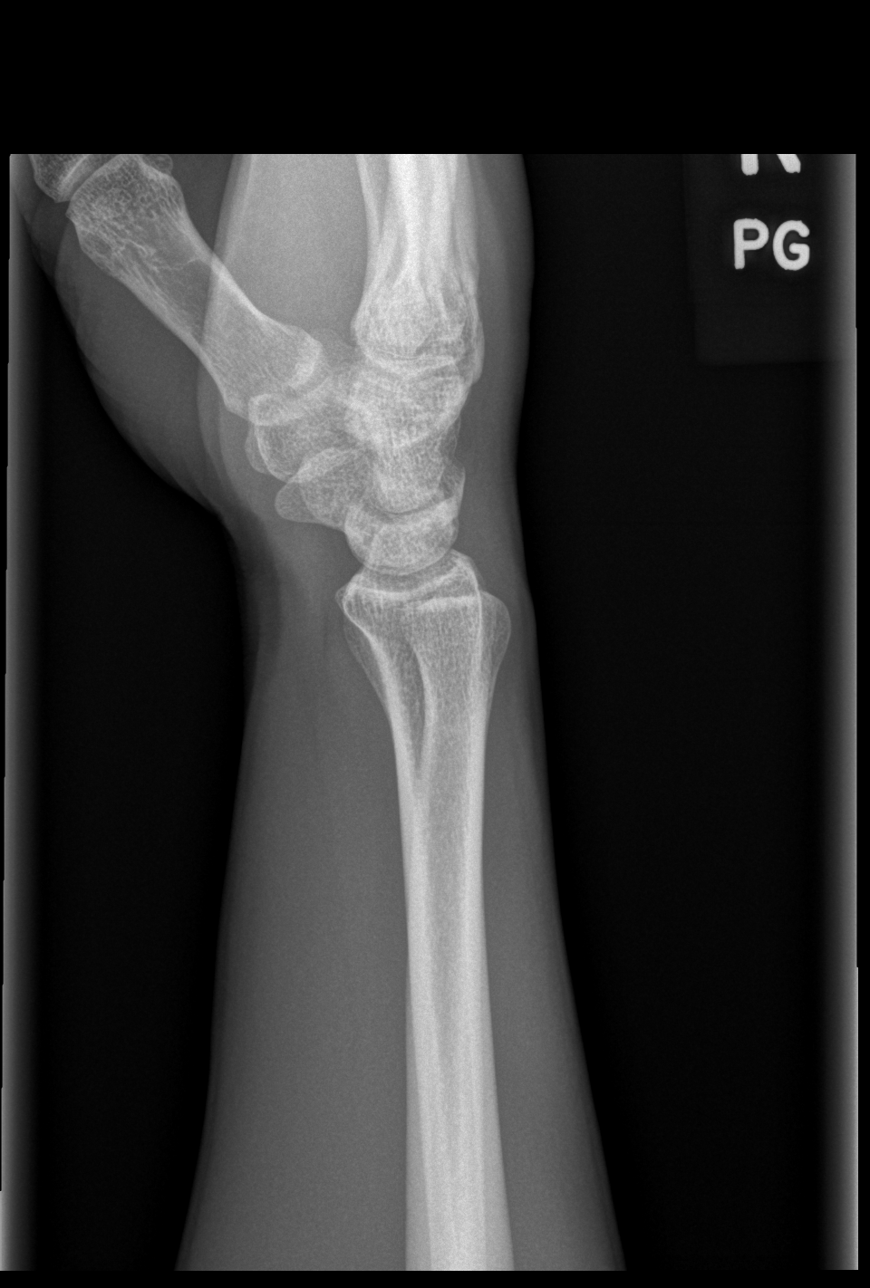

[x wrist navicular view right (1 of 3)]
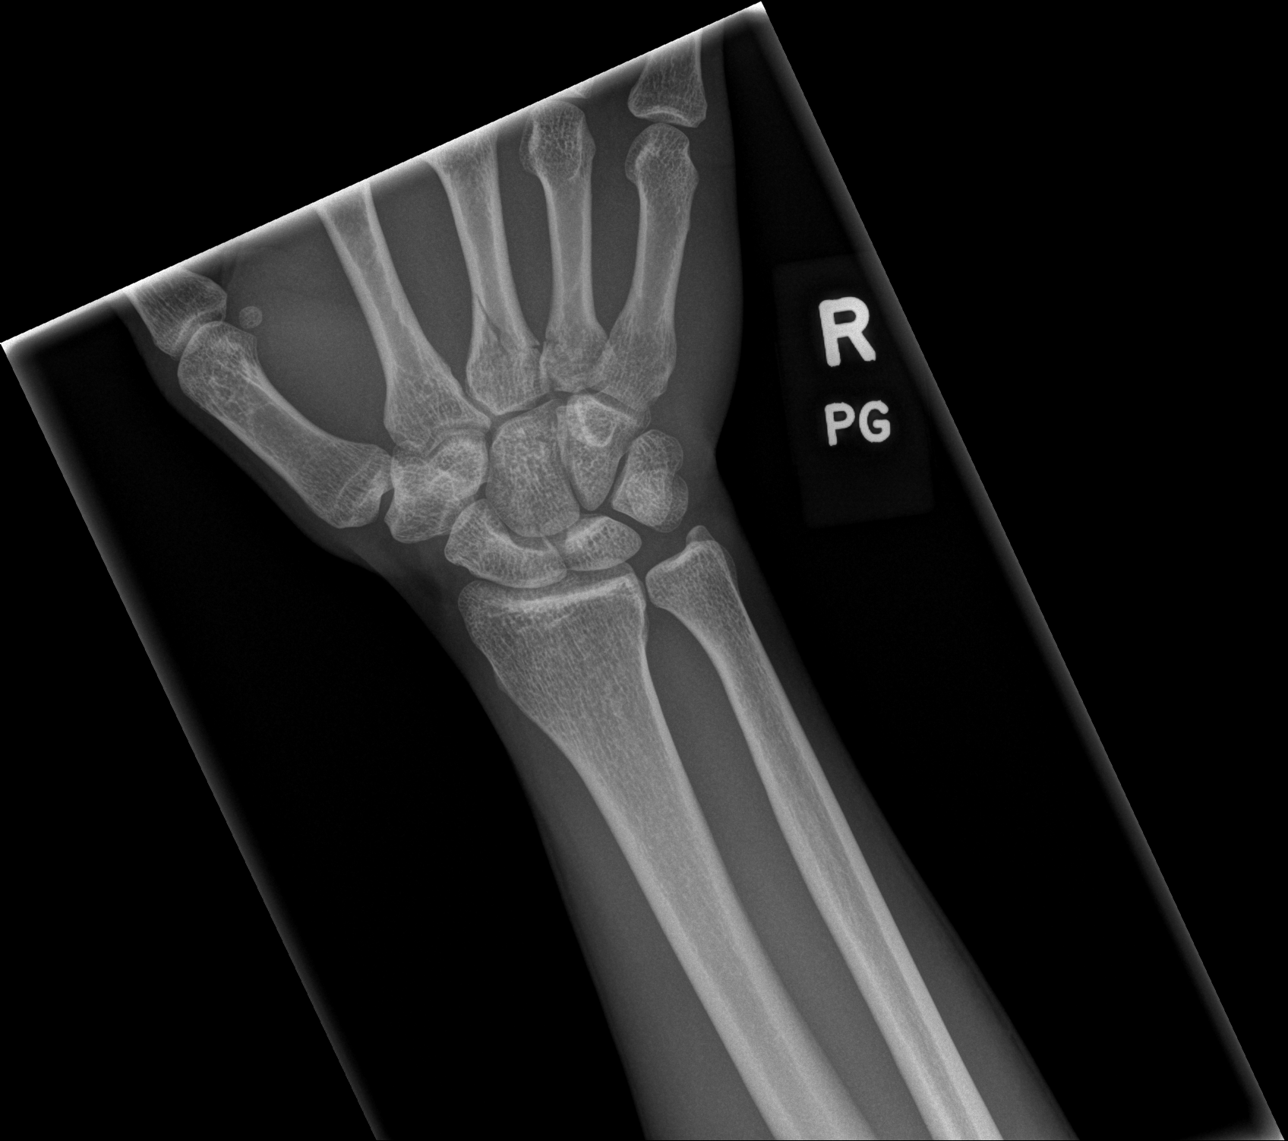

[x wrist navicular view right (2 of 3)]
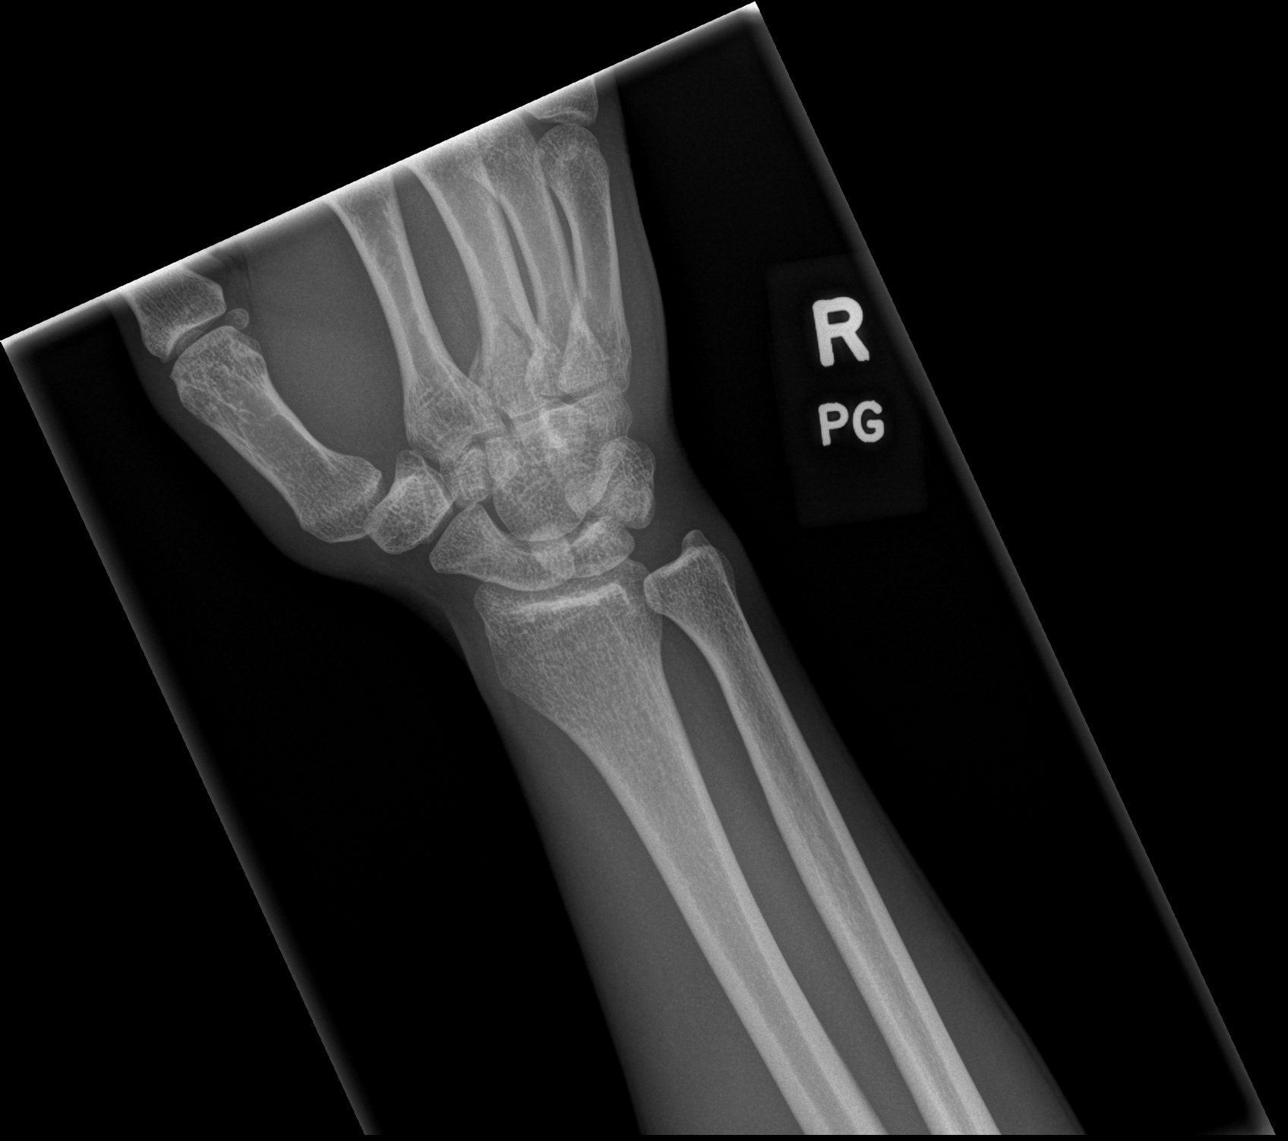

[x wrist navicular view right (3 of 3)]
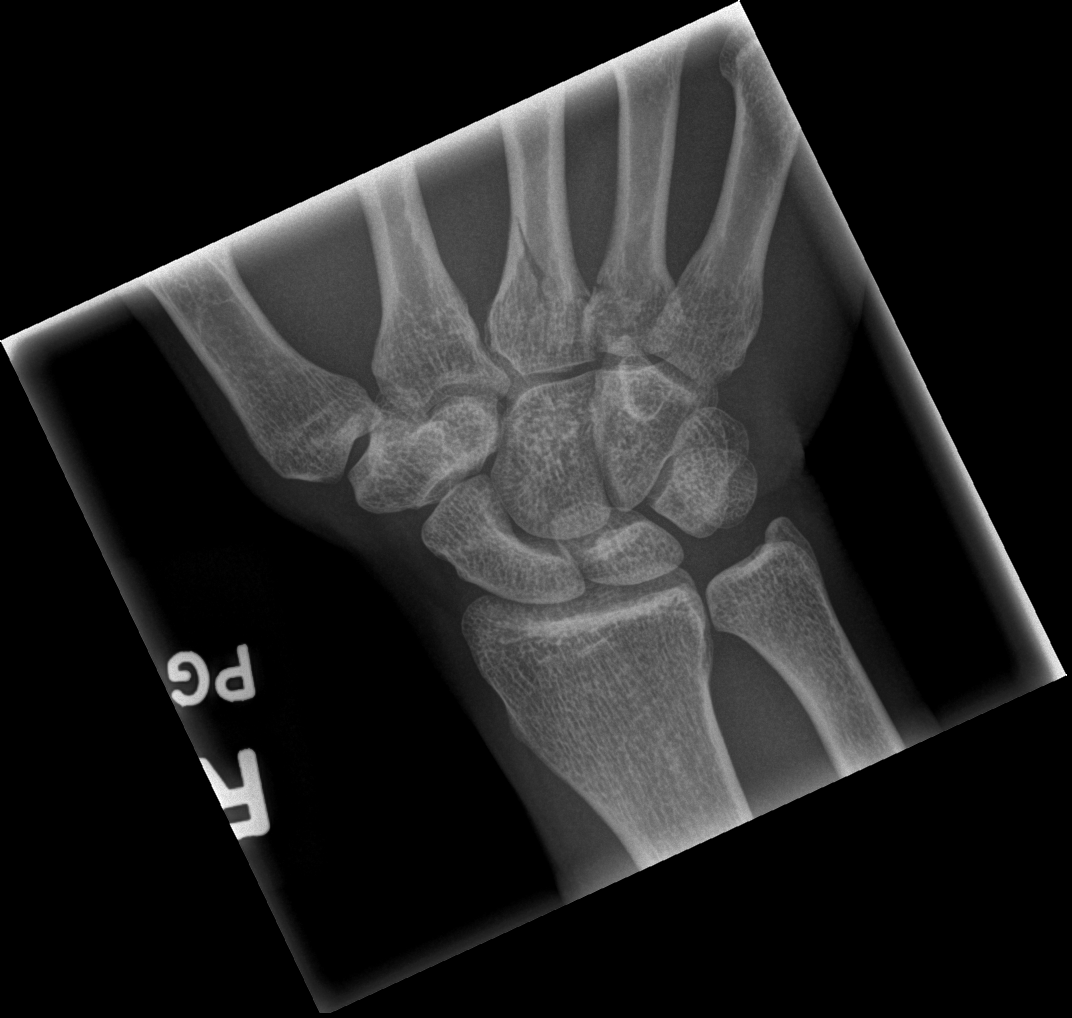

[4 of 4 positions shown; findings below may reference images not displayed]

FINDINGS: Acute, minimally displaced fracture at the base of the third and
fourth metacarpals. Adjacent soft tissue swelling.
IMPRESSION: Acute, minimally displaced fractures at the base of the third and
fourth metacarpals.

## 2023-08-26 ENCOUNTER — Emergency Department (HOSPITAL_COMMUNITY)
Admission: EM | Admit: 2023-08-26 | Discharge: 2023-08-26 | Disposition: A | Discharge status: Home / Self Care | Attending: Emergency Medicine | Admitting: Emergency Medicine

## 2023-08-26 ENCOUNTER — Emergency Department (HOSPITAL_COMMUNITY)

## 2023-08-26 ENCOUNTER — Other Ambulatory Visit: Payer: Self-pay

## 2023-08-26 ENCOUNTER — Encounter (HOSPITAL_COMMUNITY): Payer: Self-pay

## 2023-08-26 DIAGNOSIS — F17200 Nicotine dependence, unspecified, uncomplicated: Secondary | ICD-10-CM | POA: Diagnosis not present

## 2023-08-26 DIAGNOSIS — K047 Periapical abscess without sinus: Secondary | ICD-10-CM | POA: Insufficient documentation

## 2023-08-26 DIAGNOSIS — M7918 Myalgia, other site: Secondary | ICD-10-CM | POA: Insufficient documentation

## 2023-08-26 DIAGNOSIS — K122 Cellulitis and abscess of mouth: Secondary | ICD-10-CM | POA: Insufficient documentation

## 2023-08-26 DIAGNOSIS — R000 Tachycardia, unspecified: Secondary | ICD-10-CM | POA: Insufficient documentation

## 2023-08-26 DIAGNOSIS — K0889 Other specified disorders of teeth and supporting structures: Secondary | ICD-10-CM | POA: Diagnosis present

## 2023-08-26 DIAGNOSIS — K0381 Cracked tooth: Secondary | ICD-10-CM | POA: Diagnosis not present

## 2023-08-26 LAB — BASIC METABOLIC PANEL WITH GFR
Anion gap: 13 (ref 5–15)
BUN: 9 mg/dL (ref 6–20)
CO2: 23 mmol/L (ref 22–32)
Calcium: 9 mg/dL (ref 8.9–10.3)
Chloride: 102 mmol/L (ref 98–111)
Creatinine, Ser: 0.92 mg/dL (ref 0.61–1.24)
GFR, Estimated: >60 mL/min (ref >60)
Glucose, Bld: 86 mg/dL (ref 70–99)
Potassium: 3.6 mmol/L (ref 3.5–5.1)
Sodium: 138 mmol/L (ref 135–145)

## 2023-08-26 LAB — CBC WITH DIFFERENTIAL/PLATELET
Abs Immature Granulocytes: 0.03 10*3/uL (ref 0.00–0.07)
Basophils Absolute: 0.1 10*3/uL (ref 0.0–0.1)
Basophils Relative: 1 %
Eosinophils Absolute: 0.1 10*3/uL (ref 0.0–0.5)
Eosinophils Relative: 1 %
HCT: 42.2 % (ref 39.0–52.0)
Hemoglobin: 13.7 g/dL (ref 13.0–17.0)
Immature Granulocytes: 0 %
Lymphocytes Relative: 20 %
Lymphs Abs: 2 10*3/uL (ref 0.7–4.0)
MCH: 25 pg — ABNORMAL LOW (ref 26.0–34.0)
MCHC: 32.5 g/dL (ref 30.0–36.0)
MCV: 77.1 fL — ABNORMAL LOW (ref 80.0–100.0)
Monocytes Absolute: 1.1 10*3/uL — ABNORMAL HIGH (ref 0.1–1.0)
Monocytes Relative: 11 %
Neutro Abs: 6.8 10*3/uL (ref 1.7–7.7)
Neutrophils Relative %: 67 %
Platelets: 313 10*3/uL (ref 150–400)
RBC: 5.47 MIL/uL (ref 4.22–5.81)
RDW: 13.7 % (ref 11.5–15.5)
WBC: 10 10*3/uL (ref 4.0–10.5)
nRBC: 0 % (ref 0.0–0.2)

## 2023-08-26 LAB — RESP PANEL BY RT-PCR (RSV, FLU A&B, COVID)  RVPGX2
Influenza A by PCR: NEGATIVE
Influenza B by PCR: NEGATIVE
Resp Syncytial Virus by PCR: NEGATIVE
SARS Coronavirus 2 by RT PCR: NEGATIVE

## 2023-08-26 LAB — I-STAT CG4 LACTIC ACID, ED: Lactic Acid, Venous: 1.7 mmol/L (ref 0.5–1.9)

## 2023-08-26 MED ORDER — CLINDAMYCIN PHOSPHATE 600 MG/50ML IV SOLN
600.0000 mg | Freq: Once | INTRAVENOUS | Status: AC
  Administered 2023-08-26: 600 mg via INTRAVENOUS
  Filled 2023-08-26: qty 50

## 2023-08-26 MED ORDER — IOHEXOL 300 MG/ML  SOLN
75.0000 mL | Freq: Once | INTRAMUSCULAR | Status: AC | PRN
  Administered 2023-08-26: 75 mL via INTRAVENOUS

## 2023-08-26 MED ORDER — KETOROLAC TROMETHAMINE 15 MG/ML IJ SOLN
15.0000 mg | Freq: Once | INTRAMUSCULAR | Status: AC
Start: 2023-08-26 — End: 2023-08-26
  Administered 2023-08-26: 15 mg via INTRAVENOUS
  Filled 2023-08-26: qty 1

## 2023-08-26 MED ORDER — SODIUM CHLORIDE 0.9 % IV BOLUS
1000.0000 mL | Freq: Once | INTRAVENOUS | Status: AC
  Administered 2023-08-26: 1000 mL via INTRAVENOUS

## 2023-08-26 MED ORDER — CLINDAMYCIN HCL 300 MG PO CAPS: 300.0000 mg | ORAL_CAPSULE | Freq: Four times a day (QID) | ORAL | 0 refills | Status: AC

## 2023-08-26 MED ORDER — HYDROCODONE-ACETAMINOPHEN 5-325 MG PO TABS: 1.0000 | ORAL_TABLET | ORAL | 0 refills | Status: AC | PRN

## 2023-08-26 NOTE — Discharge Instructions (Signed)
 Call dentists listed above. See dental resource guide. If unable to establish visit today or if you prefer- go to the [facility] to their urgent dental clinic. [address] [place], [place]  Rinse with salt water or Listerine after every meal. Continue to brush using a soft toothbrush.   Take antibiotics as prescribed and complete the full course. For pain- START with 600mg  Advil  Liquid Gel with 650mg  Tylenol  every 6 hours.  IF the above plan is not controlling the pain, continue with the Advil  Liquid Gel, take the NORCO as prescribed in place of the Tylenol  (Norco contains Tylenol ).  DO NOT drive or operate machinery if taking NORCO.  This medication can cause constipation, take Miralax and Colace as needed to help manage this.

## 2023-08-26 NOTE — ED Provider Notes (Signed)
  38 yo with dental (right lower) abscess, febrile. CT with small abscess under canine. Pending OP follow up with [facility]. Tolerating secretions, slight trismus.    BP 99/61   Pulse 88   Temp (!) 97.5 F (36.4 C) (Oral)   Resp 18   SpO2 97%   Physical Exam HENT:     Head:     Jaw: Tenderness present. No trismus.     Mouth/Throat:      Procedures  Procedures  ED Course / MDM    Medical Decision Making Amount and/or Complexity of Data Reviewed Labs: ordered. Radiology: ordered.  Risk Prescription drug management.  Non toxic on my exam, tolerating secretions. Provided with Lolicaine with some relief of pain. Would like to be dc soon.   Case discussed with Dr. Resdie with oral surgery at [facility]. Recommends OP follow up with dentist locally if possible, if unable to be seen locally or if preferred, can [facility] to [facility] for care. Continue with Clindamycin .          Darlis Eisenmenger, PA-C 08/26/23 1914    Earma Gloss, MD 08/26/23 [phone]

## 2023-08-26 NOTE — ED Triage Notes (Signed)
 Pt came in for left sided dental pain for two days. Pt is having difficulty talking and has not been able to eat. Pt stated he broke his tooth a while ago and has not been able to see the dentist.
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
## 2023-09-05 ENCOUNTER — Emergency Department (HOSPITAL_COMMUNITY)
Admission: EM | Admit: 2023-09-05 | Discharge: 2023-09-05 | Disposition: A | Discharge status: Home / Self Care | Attending: Emergency Medicine | Admitting: Emergency Medicine

## 2023-09-05 ENCOUNTER — Emergency Department (HOSPITAL_COMMUNITY)

## 2023-09-05 ENCOUNTER — Other Ambulatory Visit: Payer: Self-pay

## 2023-09-05 DIAGNOSIS — S20212A Contusion of left front wall of thorax, initial encounter: Secondary | ICD-10-CM | POA: Diagnosis not present

## 2023-09-05 DIAGNOSIS — S29001A Unspecified injury of muscle and tendon of front wall of thorax, initial encounter: Secondary | ICD-10-CM | POA: Diagnosis present

## 2023-09-05 MED ORDER — KETOROLAC TROMETHAMINE 60 MG/2ML IM SOLN
60.0000 mg | Freq: Once | INTRAMUSCULAR | Status: AC
  Administered 2023-09-05: 60 mg via INTRAMUSCULAR
  Filled 2023-09-05: qty 2

## 2023-09-05 MED ORDER — ACETAMINOPHEN 500 MG PO TABS
1000.0000 mg | ORAL_TABLET | Freq: Once | ORAL | Status: AC
  Administered 2023-09-05: 1000 mg via ORAL
  Filled 2023-09-05: qty 2

## 2023-09-05 NOTE — Discharge Instructions (Addendum)
 Your chest x-ray that was done today was negative.  You likely have some bruised ribs.  These can be uncomfortable with deep breaths.  You can take 1000 mg of Tylenol  every 8 hours, 400 mg of ibuprofen  every 6 hours.  Follow-up with your PCP.  Return to the emergency room if you develop difficulty with your breathing or fever.
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
## 2024-03-22 ENCOUNTER — Emergency Department (HOSPITAL_COMMUNITY): Admission: EM | Admit: 2024-03-22 | Discharge: 2024-03-22 | Disposition: A | Discharge status: Home / Self Care

## 2024-03-22 ENCOUNTER — Other Ambulatory Visit (HOSPITAL_COMMUNITY): Payer: Self-pay

## 2024-03-22 ENCOUNTER — Encounter (HOSPITAL_COMMUNITY): Payer: Self-pay | Admitting: Pharmacy Technician

## 2024-03-22 ENCOUNTER — Other Ambulatory Visit: Payer: Self-pay

## 2024-03-22 DIAGNOSIS — Z202 Contact with and (suspected) exposure to infections with a predominantly sexual mode of transmission: Secondary | ICD-10-CM | POA: Diagnosis present

## 2024-03-22 MED ORDER — METRONIDAZOLE 500 MG PO TABS
2000.0000 mg | ORAL_TABLET | Freq: Once | ORAL | 0 refills | Status: AC
  Filled 2024-03-22: qty 4, 1d supply, fill #0

## 2024-03-22 MED ORDER — DOXYCYCLINE HYCLATE 100 MG PO CAPS
100.0000 mg | ORAL_CAPSULE | Freq: Two times a day (BID) | ORAL | 0 refills | Status: AC
  Filled 2024-03-22: qty 14, 7d supply, fill #0

## 2024-03-22 MED ORDER — CEFTRIAXONE SODIUM 1 G IJ SOLR
500.0000 mg | Freq: Once | INTRAMUSCULAR | Status: AC
  Administered 2024-03-22: 500 mg via INTRAMUSCULAR
  Filled 2024-03-22: qty 10
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
## 2024-03-22 NOTE — Discharge Instructions (Addendum)
 You were seen in the Emergency Department today for concerns of exposures to gonorrhea and trichomonas.  We started testing on you for gonorrhea and chlamydia to confirm findings but also started you on the appropriate antibiotics to help treat gonorrhea, trichomonas, as well as chlamydia.  You should follow-up with your primary care provider.  For any concerns of new or worsening symptoms, return to the emergency department.

## 2024-03-23 LAB — GC/CHLAMYDIA PROBE AMP (~~LOC~~) NOT AT ARMC
Chlamydia: NEGATIVE
Comment: NEGATIVE
Comment: NORMAL
Neisseria Gonorrhea: POSITIVE — AB

## 2024-03-24 ENCOUNTER — Ambulatory Visit (HOSPITAL_COMMUNITY): Payer: Self-pay
